# Patient Record
Sex: Female | Born: 1945 | Race: White | Hispanic: No | Marital: Married | Smoking: Never smoker
Health system: Southern US, Community
[De-identification: ages and names within clinical notes are randomized; demographics above are authoritative.]

## PROBLEM LIST (undated history)

## (undated) DIAGNOSIS — C801 Malignant (primary) neoplasm, unspecified: Secondary | ICD-10-CM

## (undated) HISTORY — DX: Malignant (primary) neoplasm, unspecified: C80.1

## (undated) HISTORY — PX: BREAST LUMPECTOMY: SHX2

---

## 2009-12-21 ENCOUNTER — Encounter: Admission: RE | Admit: 2009-12-21 | Discharge: 2009-12-21 | Payer: Self-pay | Admitting: *Deleted

## 2010-06-09 ENCOUNTER — Other Ambulatory Visit
Admission: RE | Admit: 2010-06-09 | Discharge: 2010-06-09 | Payer: Self-pay | Source: Home / Self Care | Admitting: Internal Medicine

## 2010-11-20 ENCOUNTER — Other Ambulatory Visit: Payer: Self-pay | Admitting: Internal Medicine

## 2010-11-20 DIAGNOSIS — Z1231 Encounter for screening mammogram for malignant neoplasm of breast: Secondary | ICD-10-CM

## 2010-12-26 ENCOUNTER — Ambulatory Visit
Admission: RE | Admit: 2010-12-26 | Discharge: 2010-12-26 | Disposition: A | Discharge status: Home / Self Care | Payer: Medicare Other | Source: Ambulatory Visit | Attending: Internal Medicine | Admitting: Internal Medicine

## 2010-12-26 DIAGNOSIS — Z1231 Encounter for screening mammogram for malignant neoplasm of breast: Secondary | ICD-10-CM

## 2011-01-11 ENCOUNTER — Other Ambulatory Visit: Payer: Self-pay | Admitting: Internal Medicine

## 2011-01-11 DIAGNOSIS — R0989 Other specified symptoms and signs involving the circulatory and respiratory systems: Secondary | ICD-10-CM

## 2011-01-16 ENCOUNTER — Ambulatory Visit
Admission: RE | Admit: 2011-01-16 | Discharge: 2011-01-16 | Disposition: A | Discharge status: Home / Self Care | Payer: Medicare Other | Source: Ambulatory Visit | Attending: Internal Medicine | Admitting: Internal Medicine

## 2011-01-16 DIAGNOSIS — R0989 Other specified symptoms and signs involving the circulatory and respiratory systems: Secondary | ICD-10-CM

## 2011-06-19 DIAGNOSIS — H2510 Age-related nuclear cataract, unspecified eye: Secondary | ICD-10-CM | POA: Diagnosis not present

## 2011-08-14 DIAGNOSIS — E785 Hyperlipidemia, unspecified: Secondary | ICD-10-CM | POA: Diagnosis not present

## 2011-08-21 DIAGNOSIS — M19079 Primary osteoarthritis, unspecified ankle and foot: Secondary | ICD-10-CM | POA: Diagnosis not present

## 2011-08-21 DIAGNOSIS — M19049 Primary osteoarthritis, unspecified hand: Secondary | ICD-10-CM | POA: Diagnosis not present

## 2011-08-21 DIAGNOSIS — E785 Hyperlipidemia, unspecified: Secondary | ICD-10-CM | POA: Diagnosis not present

## 2011-08-21 DIAGNOSIS — I6529 Occlusion and stenosis of unspecified carotid artery: Secondary | ICD-10-CM | POA: Diagnosis not present

## 2011-12-10 ENCOUNTER — Other Ambulatory Visit: Payer: Self-pay | Admitting: *Deleted

## 2011-12-10 DIAGNOSIS — Z1231 Encounter for screening mammogram for malignant neoplasm of breast: Secondary | ICD-10-CM

## 2011-12-10 DIAGNOSIS — Z853 Personal history of malignant neoplasm of breast: Secondary | ICD-10-CM

## 2011-12-10 DIAGNOSIS — M8580 Other specified disorders of bone density and structure, unspecified site: Secondary | ICD-10-CM

## 2011-12-27 ENCOUNTER — Other Ambulatory Visit: Payer: Medicare Other

## 2011-12-27 ENCOUNTER — Ambulatory Visit: Payer: Medicare Other

## 2012-01-15 ENCOUNTER — Ambulatory Visit: Payer: Medicare Other

## 2012-01-15 ENCOUNTER — Other Ambulatory Visit: Payer: Medicare Other

## 2012-01-17 ENCOUNTER — Ambulatory Visit
Admission: RE | Admit: 2012-01-17 | Discharge: 2012-01-17 | Disposition: A | Discharge status: Home / Self Care | Payer: Medicare Other | Source: Ambulatory Visit | Attending: *Deleted | Admitting: *Deleted

## 2012-01-17 DIAGNOSIS — Z853 Personal history of malignant neoplasm of breast: Secondary | ICD-10-CM

## 2012-01-17 DIAGNOSIS — Z780 Asymptomatic menopausal state: Secondary | ICD-10-CM | POA: Diagnosis not present

## 2012-01-17 DIAGNOSIS — Z1231 Encounter for screening mammogram for malignant neoplasm of breast: Secondary | ICD-10-CM

## 2012-01-17 DIAGNOSIS — M8580 Other specified disorders of bone density and structure, unspecified site: Secondary | ICD-10-CM

## 2012-01-17 DIAGNOSIS — Z13820 Encounter for screening for osteoporosis: Secondary | ICD-10-CM | POA: Diagnosis not present

## 2012-02-08 IMAGING — US US CAROTID DUPLEX BILAT
1 series · 13 of 24 positions shown · non-contrast
Comparison: None.

CLINICAL DATA: Left neck bruit.

BILATERAL CAROTID DUPLEX ULTRASOUND
TECHNIQUE: Gray scale imaging, color Doppler and duplex ultrasound
was performed of bilateral carotid and vertebral arteries in the
neck.

[Series 1: us carotid duplex bilat · 0.06mm/px · 13 of 55 slices shown]
[im 1/55]
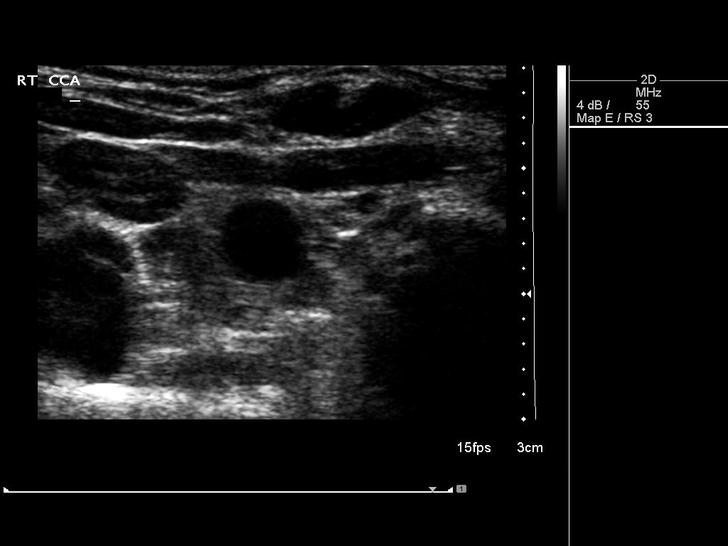
[im 5/55]
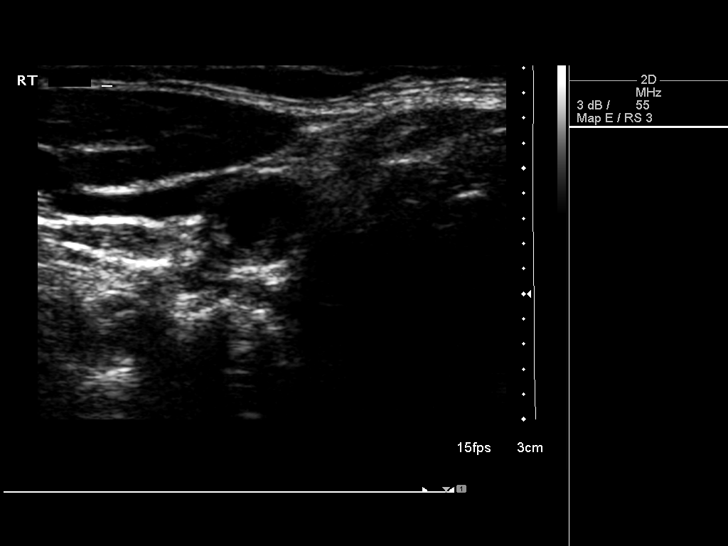
[im 10/55]
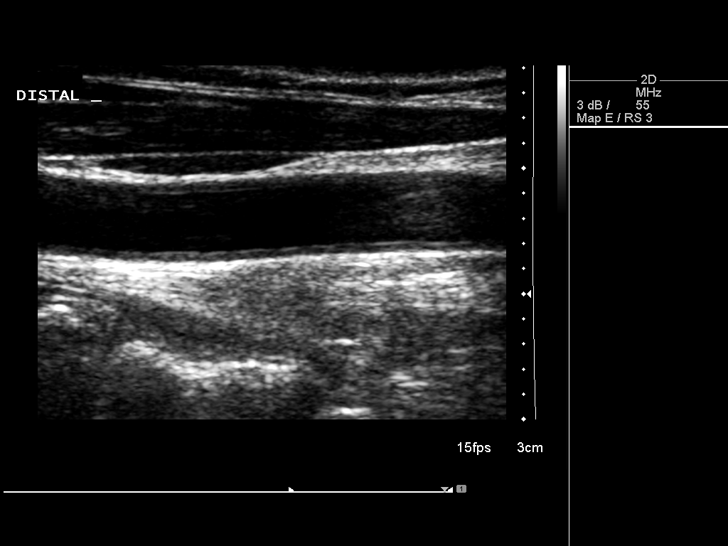
[im 15/55]
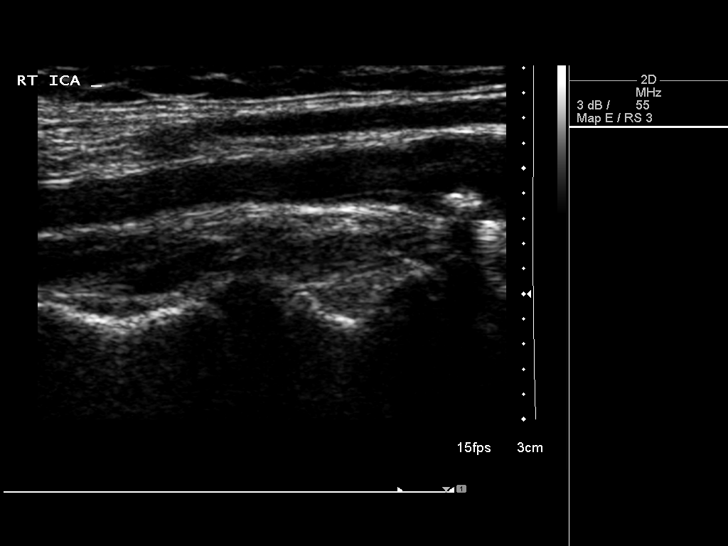
[im 19/55]
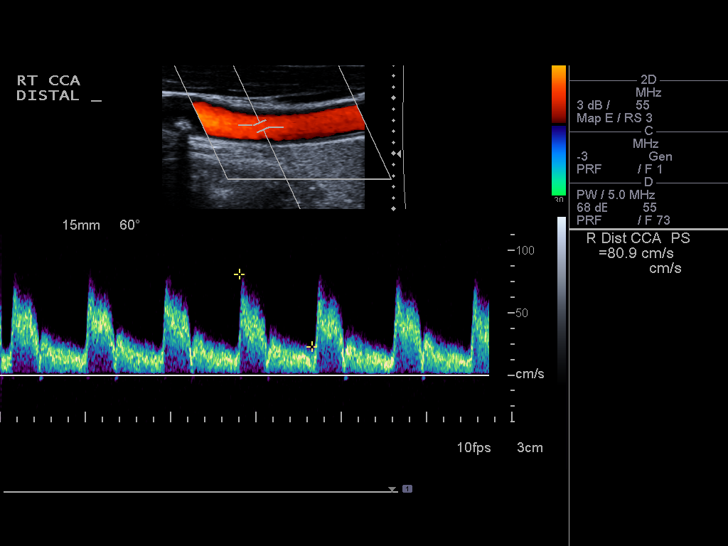
[im 24/55]
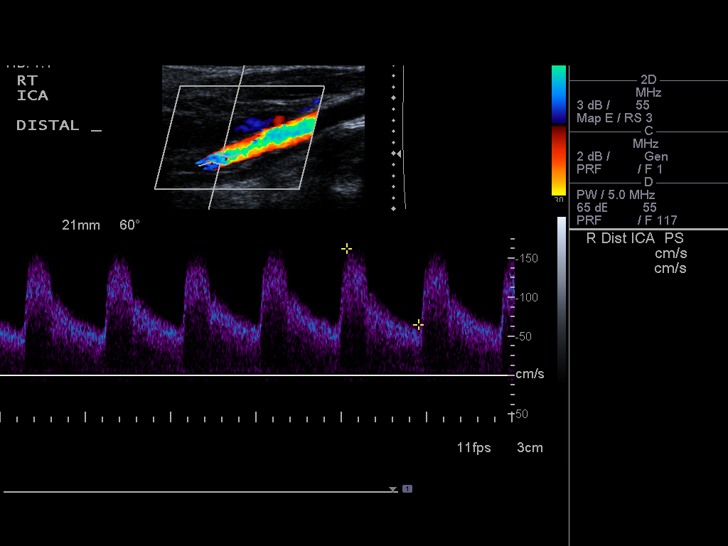
[im 29/55]
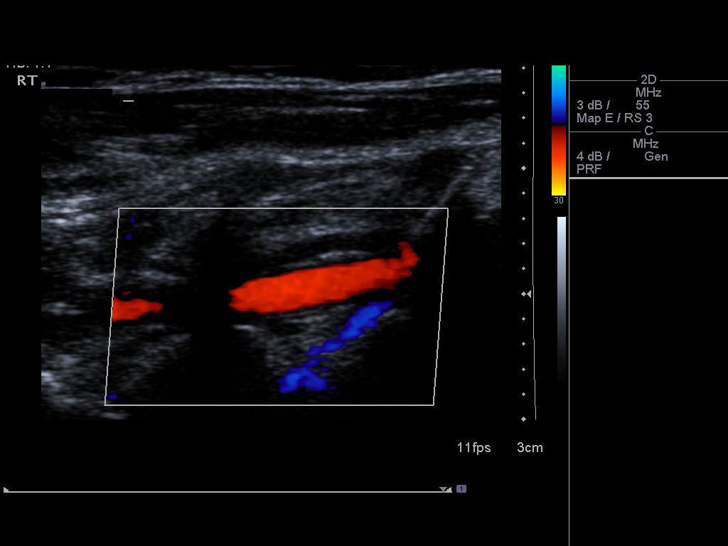
[im 31/55]
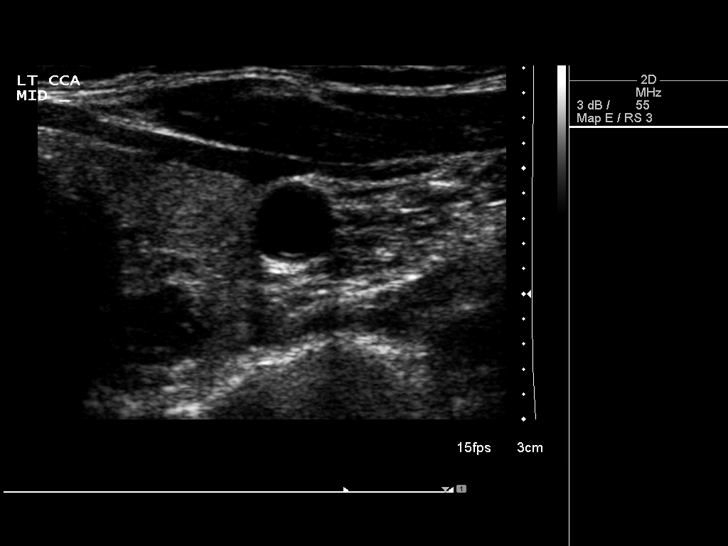
[im 36/55]
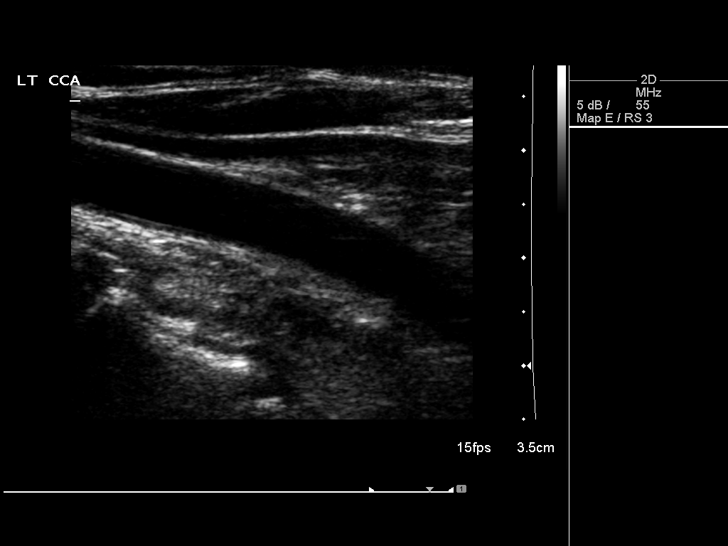
[im 40/55]
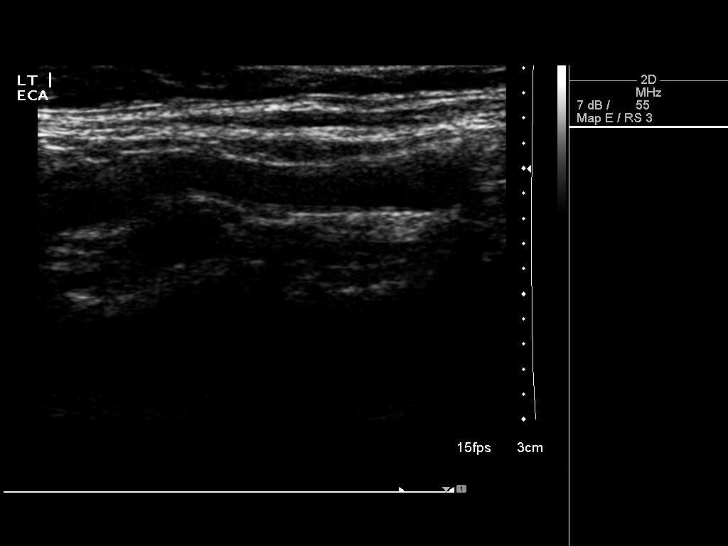
[im 45/55]
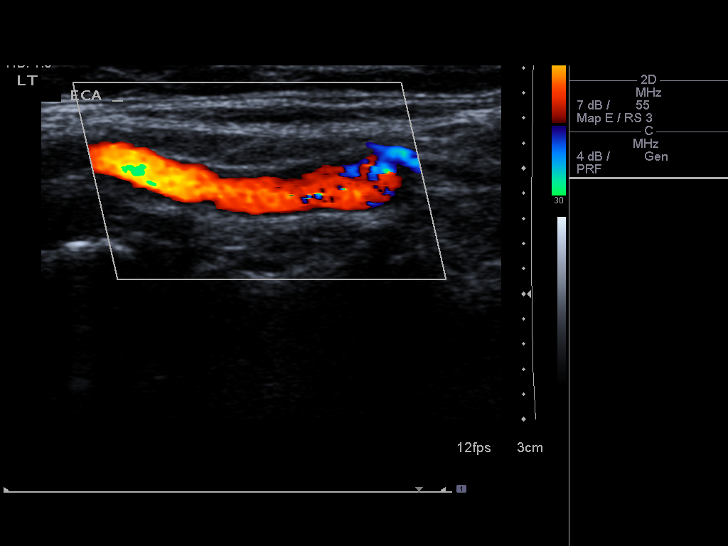
[im 50/55]
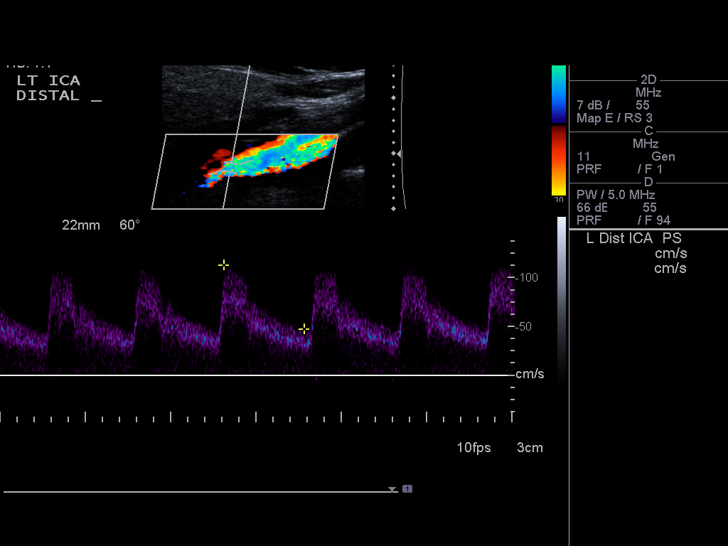
[im 55/55]
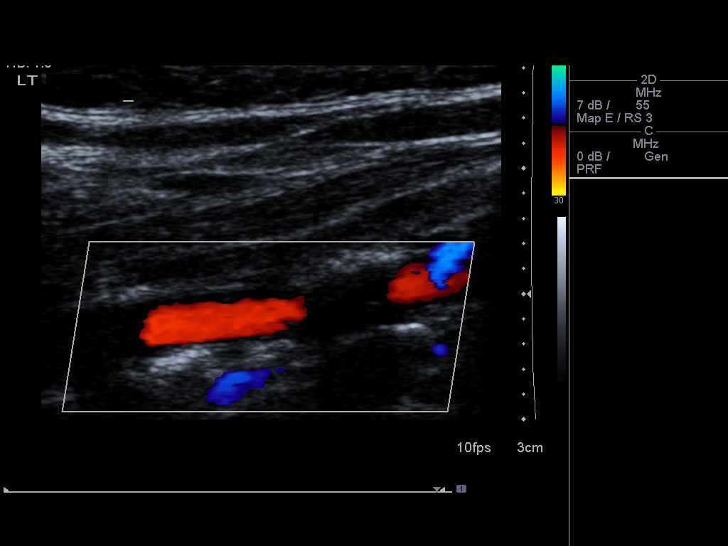

[13 of 24 positions shown; findings below may reference images not displayed]

Criteria:  Quantification of carotid stenosis is based on velocity
parameters that correlate the residual internal carotid diameter
with NASCET-based stenosis levels, using the diameter of the distal
internal carotid lumen as the denominator for stenosis measurement.

The following velocity measurements were obtained:

                 PEAK SYSTOLIC/END DIASTOLIC
RIGHT
ICA:                        162cm/sec
CCA:                        103cm/sec
SYSTOLIC ICA/CCA RATIO:
DIASTOLIC ICA/CCA RATIO:
ECA:                        102cm/sec

LEFT
ICA:                        113cm/sec
CCA:                        87cm/sec
SYSTOLIC ICA/CCA RATIO:
DIASTOLIC ICA/CCA RATIO:
ECA:                        98cm/sec
FINDINGS: RIGHT CAROTID ARTERY: Small amount of nonocclusive plaque at the
right carotid bulb.  Antegrade flow in the right carotid arteries.
The proximal right internal carotid artery velocity is slightly
elevated measuring 132 cm/sec.  The distal right internal carotid
artery end systolic velocity measures 162 cm/sec and end diastolic
velocity is 65 cm/sec.

RIGHT VERTEBRAL ARTERY:  Antegrade flow and normal waveform in the
right vertebral artery.

LEFT CAROTID ARTERY: There is a tiny amount of plaque or intimal
thickening at the left carotid bulb.  Antegrade flow in the left
carotid arteries.  Normal velocity measurements in the left
internal carotid artery.

LEFT VERTEBRAL ARTERY:  Antegrade flow and normal waveform in the
left vertebral artery.
IMPRESSION: Small amount of plaque involving the carotid bulbs, right side
greater than left.

Estimated degree of stenosis in the right internal carotid artery
is 50-69%.

Estimated degree stenosis in the left internal carotid artery is
less than 50%.

Patent bilateral vertebral arteries.

## 2012-02-14 ENCOUNTER — Other Ambulatory Visit: Payer: Self-pay | Admitting: Internal Medicine

## 2012-02-14 DIAGNOSIS — I6529 Occlusion and stenosis of unspecified carotid artery: Secondary | ICD-10-CM | POA: Diagnosis not present

## 2012-02-14 DIAGNOSIS — Z0000 Encounter for general adult medical examination without abnormal findings: Secondary | ICD-10-CM | POA: Diagnosis not present

## 2012-02-14 DIAGNOSIS — E785 Hyperlipidemia, unspecified: Secondary | ICD-10-CM | POA: Diagnosis not present

## 2012-02-25 ENCOUNTER — Ambulatory Visit
Admission: RE | Admit: 2012-02-25 | Discharge: 2012-02-25 | Disposition: A | Discharge status: Home / Self Care | Payer: Medicare Other | Source: Ambulatory Visit | Attending: Internal Medicine | Admitting: Internal Medicine

## 2012-02-25 DIAGNOSIS — I6529 Occlusion and stenosis of unspecified carotid artery: Secondary | ICD-10-CM | POA: Diagnosis not present

## 2012-02-29 DIAGNOSIS — E785 Hyperlipidemia, unspecified: Secondary | ICD-10-CM | POA: Diagnosis not present

## 2012-02-29 DIAGNOSIS — M19079 Primary osteoarthritis, unspecified ankle and foot: Secondary | ICD-10-CM | POA: Diagnosis not present

## 2012-02-29 DIAGNOSIS — Z23 Encounter for immunization: Secondary | ICD-10-CM | POA: Diagnosis not present

## 2012-02-29 DIAGNOSIS — M19049 Primary osteoarthritis, unspecified hand: Secondary | ICD-10-CM | POA: Diagnosis not present

## 2012-02-29 DIAGNOSIS — I6529 Occlusion and stenosis of unspecified carotid artery: Secondary | ICD-10-CM | POA: Diagnosis not present

## 2012-03-19 DIAGNOSIS — C50319 Malignant neoplasm of lower-inner quadrant of unspecified female breast: Secondary | ICD-10-CM | POA: Diagnosis not present

## 2012-06-18 DIAGNOSIS — R55 Syncope and collapse: Secondary | ICD-10-CM | POA: Diagnosis not present

## 2012-06-18 DIAGNOSIS — R252 Cramp and spasm: Secondary | ICD-10-CM | POA: Diagnosis not present

## 2012-06-30 DIAGNOSIS — H2510 Age-related nuclear cataract, unspecified eye: Secondary | ICD-10-CM | POA: Diagnosis not present

## 2012-07-23 DIAGNOSIS — D485 Neoplasm of uncertain behavior of skin: Secondary | ICD-10-CM | POA: Diagnosis not present

## 2012-07-23 DIAGNOSIS — R21 Rash and other nonspecific skin eruption: Secondary | ICD-10-CM | POA: Diagnosis not present

## 2012-07-23 DIAGNOSIS — L259 Unspecified contact dermatitis, unspecified cause: Secondary | ICD-10-CM | POA: Diagnosis not present

## 2012-07-23 DIAGNOSIS — L570 Actinic keratosis: Secondary | ICD-10-CM | POA: Diagnosis not present

## 2012-07-23 DIAGNOSIS — L439 Lichen planus, unspecified: Secondary | ICD-10-CM | POA: Diagnosis not present

## 2012-08-21 DIAGNOSIS — M899 Disorder of bone, unspecified: Secondary | ICD-10-CM | POA: Diagnosis not present

## 2012-08-21 DIAGNOSIS — M949 Disorder of cartilage, unspecified: Secondary | ICD-10-CM | POA: Diagnosis not present

## 2012-08-21 DIAGNOSIS — E785 Hyperlipidemia, unspecified: Secondary | ICD-10-CM | POA: Diagnosis not present

## 2012-08-26 DIAGNOSIS — M19049 Primary osteoarthritis, unspecified hand: Secondary | ICD-10-CM | POA: Diagnosis not present

## 2012-08-26 DIAGNOSIS — E785 Hyperlipidemia, unspecified: Secondary | ICD-10-CM | POA: Diagnosis not present

## 2012-08-26 DIAGNOSIS — I6529 Occlusion and stenosis of unspecified carotid artery: Secondary | ICD-10-CM | POA: Diagnosis not present

## 2012-08-26 DIAGNOSIS — M899 Disorder of bone, unspecified: Secondary | ICD-10-CM | POA: Diagnosis not present

## 2012-08-29 DIAGNOSIS — L439 Lichen planus, unspecified: Secondary | ICD-10-CM | POA: Diagnosis not present

## 2012-10-29 DIAGNOSIS — L439 Lichen planus, unspecified: Secondary | ICD-10-CM | POA: Diagnosis not present

## 2012-11-10 DIAGNOSIS — S20219A Contusion of unspecified front wall of thorax, initial encounter: Secondary | ICD-10-CM | POA: Diagnosis not present

## 2012-11-10 DIAGNOSIS — S2239XA Fracture of one rib, unspecified side, initial encounter for closed fracture: Secondary | ICD-10-CM | POA: Diagnosis not present

## 2012-12-09 ENCOUNTER — Other Ambulatory Visit: Payer: Self-pay

## 2012-12-09 DIAGNOSIS — Z1231 Encounter for screening mammogram for malignant neoplasm of breast: Secondary | ICD-10-CM

## 2013-02-23 DIAGNOSIS — E785 Hyperlipidemia, unspecified: Secondary | ICD-10-CM | POA: Diagnosis not present

## 2013-02-23 DIAGNOSIS — Z23 Encounter for immunization: Secondary | ICD-10-CM | POA: Diagnosis not present

## 2013-02-23 DIAGNOSIS — Z0000 Encounter for general adult medical examination without abnormal findings: Secondary | ICD-10-CM | POA: Diagnosis not present

## 2013-02-23 DIAGNOSIS — M899 Disorder of bone, unspecified: Secondary | ICD-10-CM | POA: Diagnosis not present

## 2013-02-23 DIAGNOSIS — Z1331 Encounter for screening for depression: Secondary | ICD-10-CM | POA: Diagnosis not present

## 2013-02-24 ENCOUNTER — Ambulatory Visit: Payer: Medicare Other

## 2013-02-24 ENCOUNTER — Other Ambulatory Visit: Payer: Self-pay | Admitting: Internal Medicine

## 2013-02-24 ENCOUNTER — Ambulatory Visit
Admission: RE | Admit: 2013-02-24 | Discharge: 2013-02-24 | Disposition: A | Discharge status: Home / Self Care | Payer: Medicare Other | Source: Ambulatory Visit

## 2013-02-24 DIAGNOSIS — Z1231 Encounter for screening mammogram for malignant neoplasm of breast: Secondary | ICD-10-CM

## 2013-03-02 DIAGNOSIS — I6529 Occlusion and stenosis of unspecified carotid artery: Secondary | ICD-10-CM | POA: Diagnosis not present

## 2013-03-02 DIAGNOSIS — E785 Hyperlipidemia, unspecified: Secondary | ICD-10-CM | POA: Diagnosis not present

## 2013-03-02 DIAGNOSIS — M899 Disorder of bone, unspecified: Secondary | ICD-10-CM | POA: Diagnosis not present

## 2013-03-02 DIAGNOSIS — N182 Chronic kidney disease, stage 2 (mild): Secondary | ICD-10-CM | POA: Diagnosis not present

## 2013-03-19 IMAGING — US US CAROTID DUPLEX BILAT
1 series · 13 of 24 positions shown · non-contrast
Comparison: 01/16/2011

CLINICAL DATA: Occlusion and stenosis

BILATERAL CAROTID DUPLEX ULTRASOUND
TECHNIQUE: Gray scale imaging, color Doppler and duplex ultrasound
was performed of bilateral carotid and vertebral arteries in the
neck.

[Series 1: us carotid duplex bilat · 0.06mm/px · 13 of 62 slices shown]
[im 1/62]
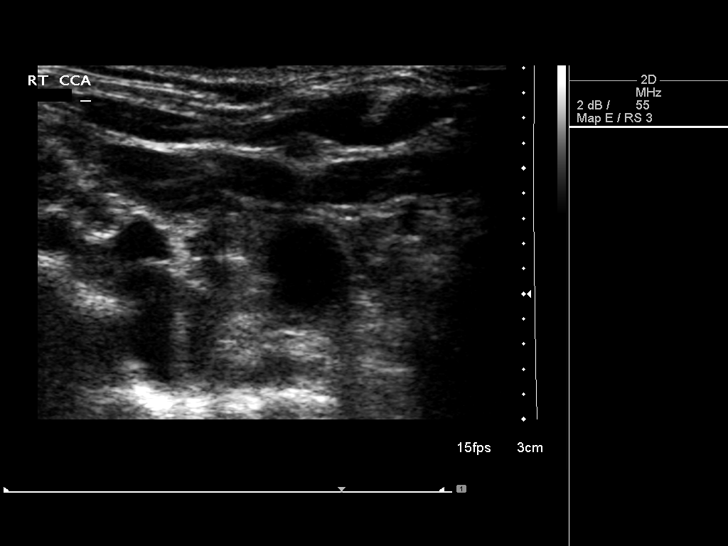
[im 6/62]
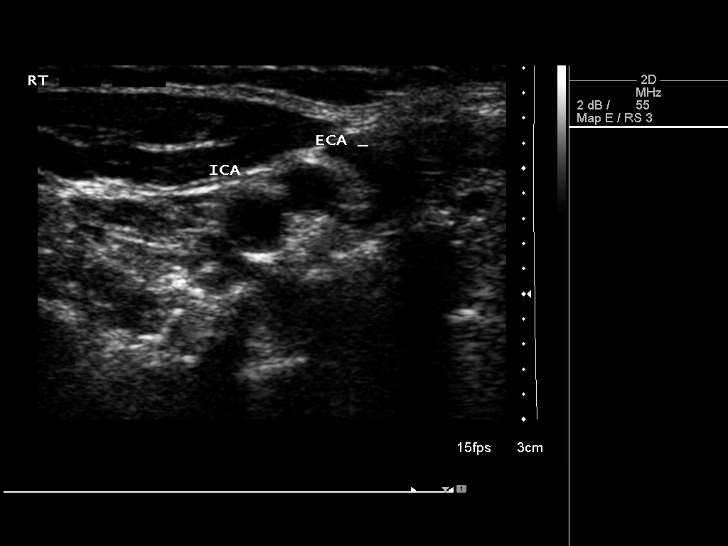
[im 11/62]
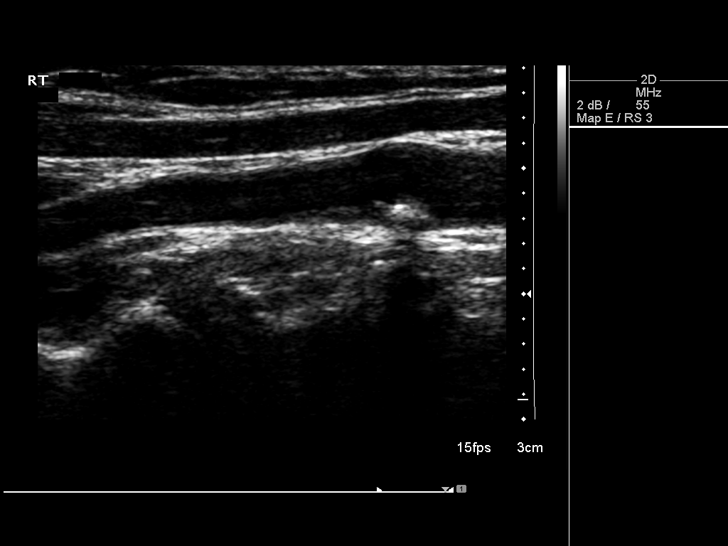
[im 16/62]
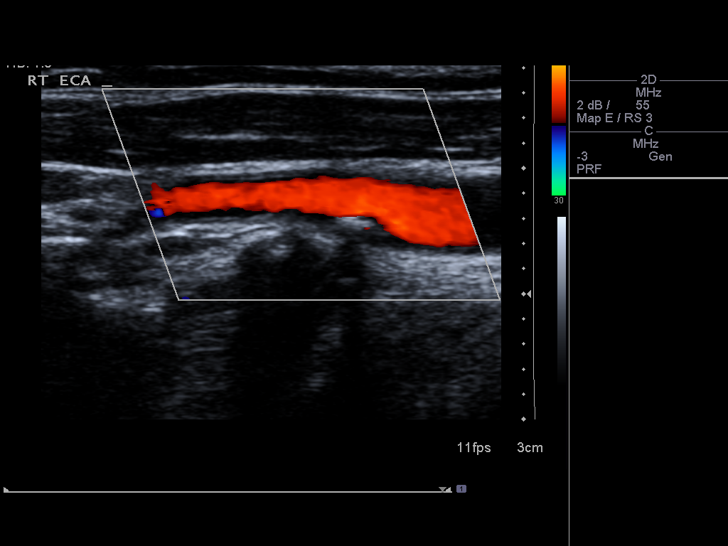
[im 22/62]
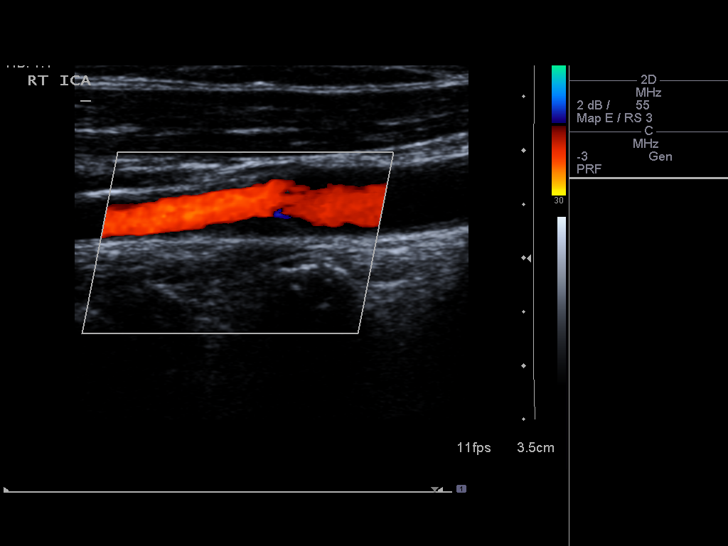
[im 27/62]
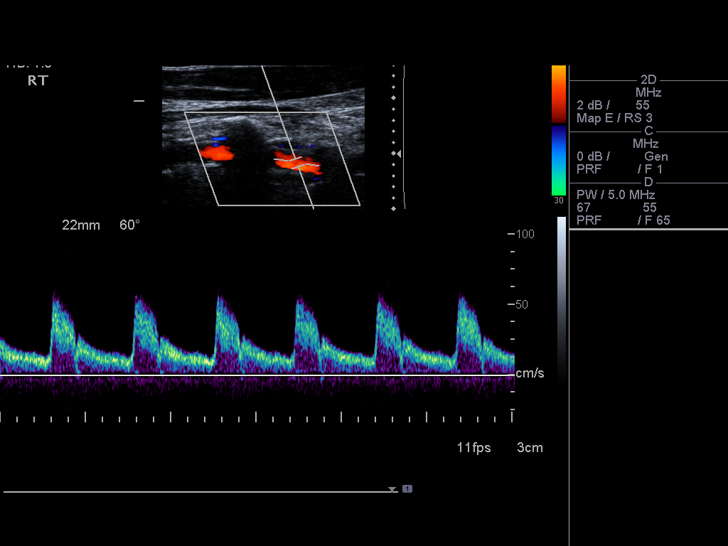
[im 32/62]
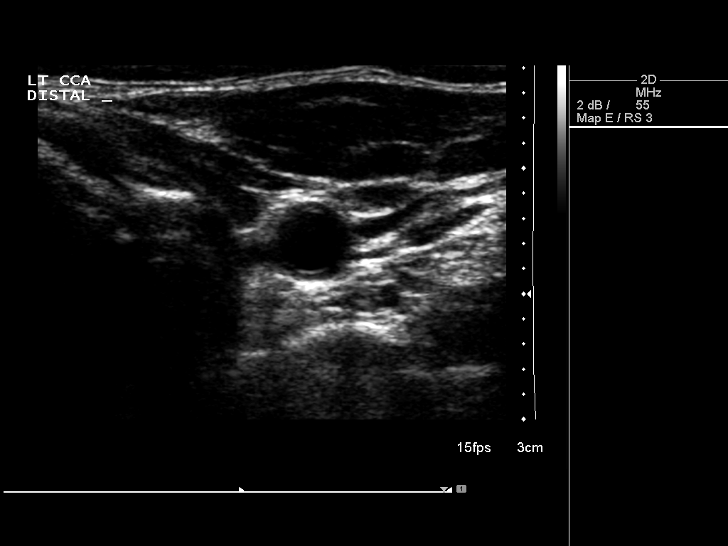
[im 35/62]
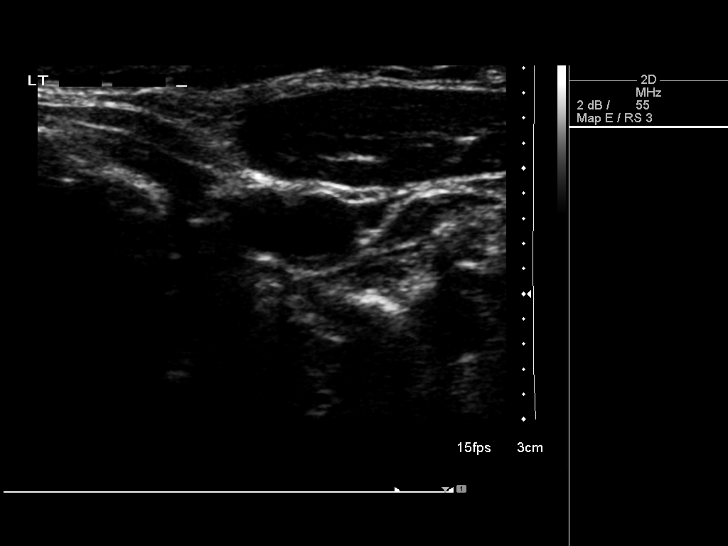
[im 40/62]
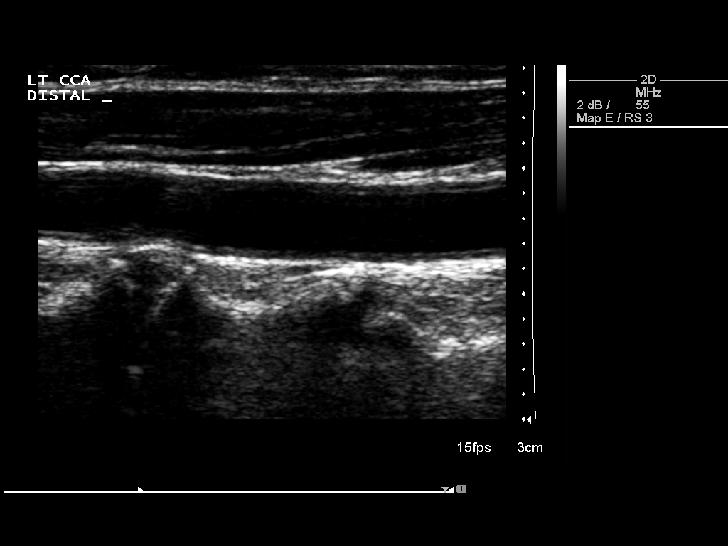
[im 46/62]
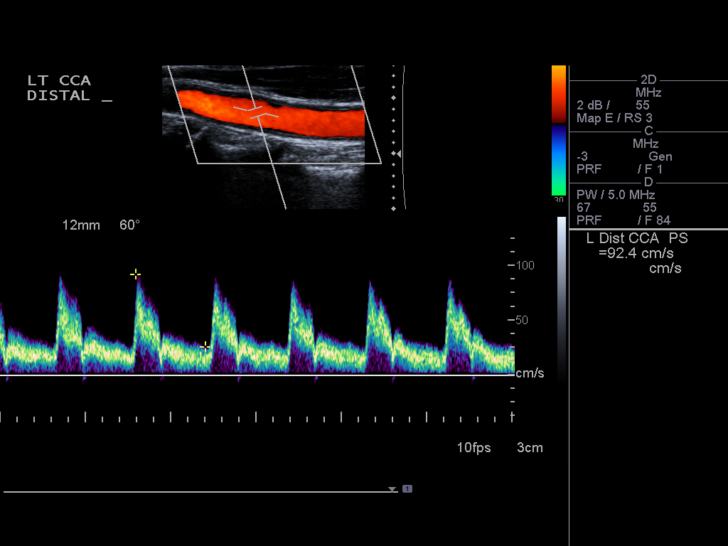
[im 51/62]
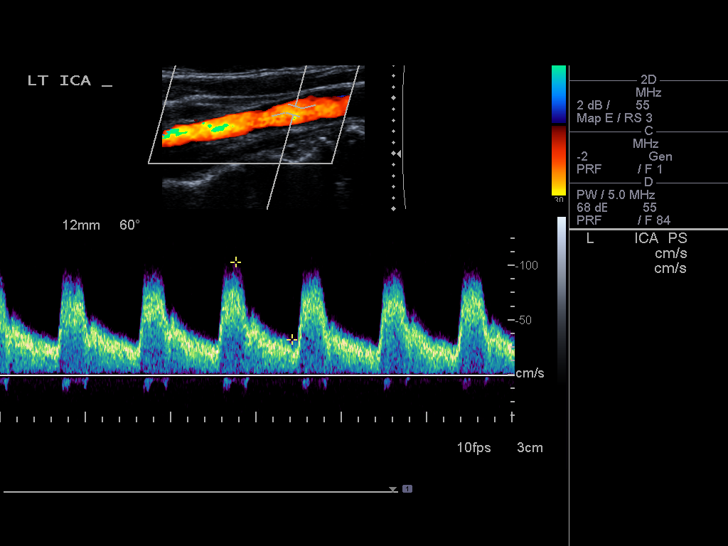
[im 56/62]
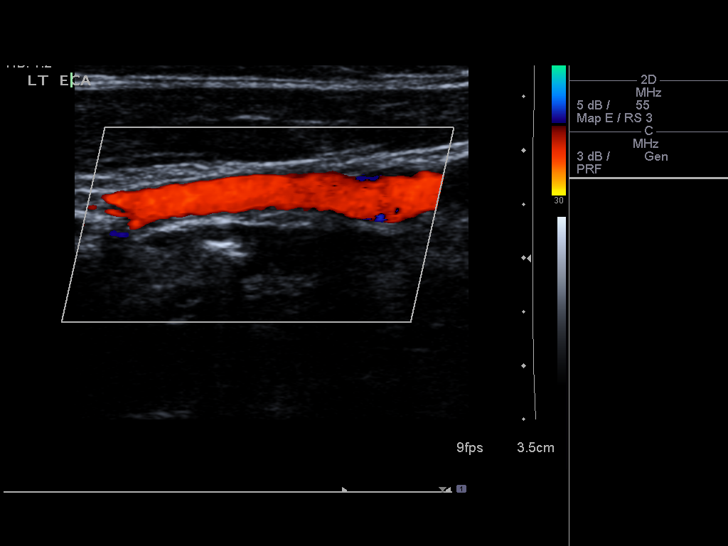
[im 62/62]
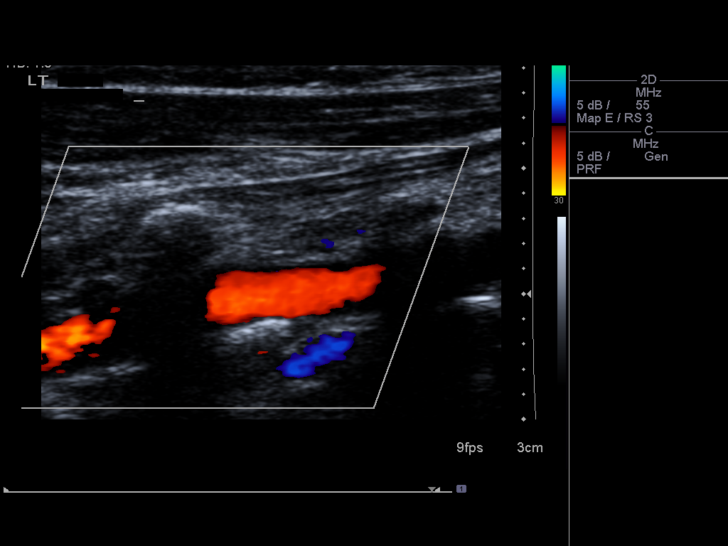

[13 of 24 positions shown; findings below may reference images not displayed]

Criteria:  Quantification of carotid stenosis is based on velocity
parameters that correlate the residual internal carotid diameter
with NASCET-based stenosis levels, using the diameter of the distal
internal carotid lumen as the denominator for stenosis measurement.

The following velocity measurements were obtained:

                 PEAK SYSTOLIC/END DIASTOLIC
RIGHT
ICA:                        152/57cm/sec
CCA:                        87/24cm/sec
SYSTOLIC ICA/CCA RATIO:
DIASTOLIC ICA/CCA RATIO:
ECA:                        110cm/sec

LEFT
ICA:                        113/42cm/sec
CCA:                        96/25cm/sec
SYSTOLIC ICA/CCA RATIO:
DIASTOLIC ICA/CCA RATIO:
ECA:                        113cm/sec
FINDINGS: RIGHT CAROTID ARTERY: The preliminary gray scale images demonstrate
intimal thickening is well as atherosclerotic plaque in the region
of the carotid bulb and proximal internal carotid artery.  This
contributes to a stenosis in the 50-69% range in the lower end of
that range.

RIGHT VERTEBRAL ARTERY:  Antegrade in nature

LEFT CAROTID ARTERY: Mild plaque is noted on the left in the
carotid bulb extending in the proximal internal carotid artery.
The waveforms, velocities and flow velocity ratios however
demonstrate no evidence of focal hemodynamically significant
stenosis.

LEFT VERTEBRAL ARTERY:  Antegrade in nature
IMPRESSION: Stenosis in the 50-69% range and the right internal carotid artery.
No focal hemodynamically significant stenosis is noted on the left.
The overall appearance is similar to that seen on the prior exam.

## 2013-03-25 DIAGNOSIS — C50319 Malignant neoplasm of lower-inner quadrant of unspecified female breast: Secondary | ICD-10-CM | POA: Diagnosis not present

## 2013-04-01 ENCOUNTER — Ambulatory Visit
Admission: RE | Admit: 2013-04-01 | Discharge: 2013-04-01 | Disposition: A | Discharge status: Home / Self Care | Payer: Medicare Other | Source: Ambulatory Visit | Attending: Internal Medicine | Admitting: Internal Medicine

## 2013-04-01 DIAGNOSIS — I658 Occlusion and stenosis of other precerebral arteries: Secondary | ICD-10-CM | POA: Diagnosis not present

## 2013-06-03 DIAGNOSIS — L439 Lichen planus, unspecified: Secondary | ICD-10-CM | POA: Diagnosis not present

## 2013-06-03 DIAGNOSIS — L259 Unspecified contact dermatitis, unspecified cause: Secondary | ICD-10-CM | POA: Diagnosis not present

## 2013-06-03 DIAGNOSIS — L821 Other seborrheic keratosis: Secondary | ICD-10-CM | POA: Diagnosis not present

## 2013-06-03 DIAGNOSIS — D1801 Hemangioma of skin and subcutaneous tissue: Secondary | ICD-10-CM | POA: Diagnosis not present

## 2013-06-09 DIAGNOSIS — L600 Ingrowing nail: Secondary | ICD-10-CM | POA: Diagnosis not present

## 2013-06-15 ENCOUNTER — Ambulatory Visit: Payer: Self-pay | Admitting: Podiatry

## 2013-06-18 ENCOUNTER — Ambulatory Visit: Payer: Medicare Other | Admitting: Podiatry

## 2013-06-18 ENCOUNTER — Encounter: Payer: Self-pay | Admitting: Podiatry

## 2013-06-18 ENCOUNTER — Ambulatory Visit: Payer: Self-pay | Admitting: Podiatrist

## 2013-06-18 VITALS — BP 161/79 | HR 80 | Resp 16 | Ht 66.0 in | Wt 155.0 lb

## 2013-06-18 DIAGNOSIS — L600 Ingrowing nail: Secondary | ICD-10-CM | POA: Diagnosis not present

## 2013-06-18 NOTE — Progress Notes (Signed)
Subjective:     Patient ID: Jill Morton, female   DOB: 26-Oct-1945, 68 y.o.   MRN: [id number]  HPI patient states I am getting ingrown toenails on both my big toes. States the right one hurts on both borders and the left one hurts on the outside border against her shoe she is trying to trim them and so without relief of symptoms  Review of Systems     Objective:   Physical Exam Neurovascular status intact with incurvated ingrown toenail deformity big toe right medial and lateral border and medial border left hallux with pain upon palpation    Assessment:     Ingrown toenail deformity hallux both feet medial and lateral border right medial border left    Plan:     H&P reviewed and condition discussed. I reviewed the permanent removal of nail corners and explained procedure reviewing risks of surgery. Patient wants to have this performed understands risk and at this time I infiltrated each hallux 60 mg Xylocaine Marcaine mixture remove the medial and lateral border right hallux medial border left hallux exposed matrix and applied chemical phenol 30 seconds to each border 3 applications followed by alcohol lavaged and sterile dressing. Reappoint to recheck again

## 2013-06-18 NOTE — Progress Notes (Signed)
   Subjective:    Patient ID: Jill Morton, female    DOB: 1946/01/16, 68 y.o.   MRN: [id number]  HPI Comments: N pain L b/l great toenails- rt is worse D 1 m O sudden C same A bed sheets T wearing different shoes     Review of Systems  All other systems reviewed and are negative.       Objective:   Physical Exam        Assessment & Plan:

## 2013-06-18 NOTE — Patient Instructions (Signed)

## 2013-07-06 DIAGNOSIS — H2510 Age-related nuclear cataract, unspecified eye: Secondary | ICD-10-CM | POA: Diagnosis not present

## 2013-08-31 DIAGNOSIS — M899 Disorder of bone, unspecified: Secondary | ICD-10-CM | POA: Diagnosis not present

## 2013-08-31 DIAGNOSIS — M949 Disorder of cartilage, unspecified: Secondary | ICD-10-CM | POA: Diagnosis not present

## 2013-08-31 DIAGNOSIS — E785 Hyperlipidemia, unspecified: Secondary | ICD-10-CM | POA: Diagnosis not present

## 2013-09-07 DIAGNOSIS — N182 Chronic kidney disease, stage 2 (mild): Secondary | ICD-10-CM | POA: Diagnosis not present

## 2013-09-07 DIAGNOSIS — M949 Disorder of cartilage, unspecified: Secondary | ICD-10-CM | POA: Diagnosis not present

## 2013-09-07 DIAGNOSIS — I6529 Occlusion and stenosis of unspecified carotid artery: Secondary | ICD-10-CM | POA: Diagnosis not present

## 2013-09-07 DIAGNOSIS — M899 Disorder of bone, unspecified: Secondary | ICD-10-CM | POA: Diagnosis not present

## 2013-09-07 DIAGNOSIS — E785 Hyperlipidemia, unspecified: Secondary | ICD-10-CM | POA: Diagnosis not present

## 2013-10-24 DIAGNOSIS — K5732 Diverticulitis of large intestine without perforation or abscess without bleeding: Secondary | ICD-10-CM | POA: Diagnosis not present

## 2014-01-04 DIAGNOSIS — M5137 Other intervertebral disc degeneration, lumbosacral region: Secondary | ICD-10-CM | POA: Diagnosis not present

## 2014-01-04 DIAGNOSIS — M5030 Other cervical disc degeneration, unspecified cervical region: Secondary | ICD-10-CM | POA: Diagnosis not present

## 2014-01-04 DIAGNOSIS — M999 Biomechanical lesion, unspecified: Secondary | ICD-10-CM | POA: Diagnosis not present

## 2014-01-04 DIAGNOSIS — M9981 Other biomechanical lesions of cervical region: Secondary | ICD-10-CM | POA: Diagnosis not present

## 2014-01-05 DIAGNOSIS — M5137 Other intervertebral disc degeneration, lumbosacral region: Secondary | ICD-10-CM | POA: Diagnosis not present

## 2014-01-05 DIAGNOSIS — M999 Biomechanical lesion, unspecified: Secondary | ICD-10-CM | POA: Diagnosis not present

## 2014-01-05 DIAGNOSIS — M9981 Other biomechanical lesions of cervical region: Secondary | ICD-10-CM | POA: Diagnosis not present

## 2014-01-05 DIAGNOSIS — M5030 Other cervical disc degeneration, unspecified cervical region: Secondary | ICD-10-CM | POA: Diagnosis not present

## 2014-01-06 DIAGNOSIS — M999 Biomechanical lesion, unspecified: Secondary | ICD-10-CM | POA: Diagnosis not present

## 2014-01-06 DIAGNOSIS — M5030 Other cervical disc degeneration, unspecified cervical region: Secondary | ICD-10-CM | POA: Diagnosis not present

## 2014-01-06 DIAGNOSIS — M5137 Other intervertebral disc degeneration, lumbosacral region: Secondary | ICD-10-CM | POA: Diagnosis not present

## 2014-01-06 DIAGNOSIS — M9981 Other biomechanical lesions of cervical region: Secondary | ICD-10-CM | POA: Diagnosis not present

## 2014-01-07 DIAGNOSIS — M999 Biomechanical lesion, unspecified: Secondary | ICD-10-CM | POA: Diagnosis not present

## 2014-01-07 DIAGNOSIS — M5030 Other cervical disc degeneration, unspecified cervical region: Secondary | ICD-10-CM | POA: Diagnosis not present

## 2014-01-07 DIAGNOSIS — M9981 Other biomechanical lesions of cervical region: Secondary | ICD-10-CM | POA: Diagnosis not present

## 2014-01-07 DIAGNOSIS — M5137 Other intervertebral disc degeneration, lumbosacral region: Secondary | ICD-10-CM | POA: Diagnosis not present

## 2014-01-18 DIAGNOSIS — M5137 Other intervertebral disc degeneration, lumbosacral region: Secondary | ICD-10-CM | POA: Diagnosis not present

## 2014-01-18 DIAGNOSIS — M5030 Other cervical disc degeneration, unspecified cervical region: Secondary | ICD-10-CM | POA: Diagnosis not present

## 2014-01-18 DIAGNOSIS — M9981 Other biomechanical lesions of cervical region: Secondary | ICD-10-CM | POA: Diagnosis not present

## 2014-01-18 DIAGNOSIS — M999 Biomechanical lesion, unspecified: Secondary | ICD-10-CM | POA: Diagnosis not present

## 2014-01-19 DIAGNOSIS — M5137 Other intervertebral disc degeneration, lumbosacral region: Secondary | ICD-10-CM | POA: Diagnosis not present

## 2014-01-19 DIAGNOSIS — M5030 Other cervical disc degeneration, unspecified cervical region: Secondary | ICD-10-CM | POA: Diagnosis not present

## 2014-01-19 DIAGNOSIS — M9981 Other biomechanical lesions of cervical region: Secondary | ICD-10-CM | POA: Diagnosis not present

## 2014-01-19 DIAGNOSIS — M999 Biomechanical lesion, unspecified: Secondary | ICD-10-CM | POA: Diagnosis not present

## 2014-01-20 ENCOUNTER — Other Ambulatory Visit: Payer: Self-pay

## 2014-01-20 DIAGNOSIS — M9981 Other biomechanical lesions of cervical region: Secondary | ICD-10-CM | POA: Diagnosis not present

## 2014-01-20 DIAGNOSIS — M5030 Other cervical disc degeneration, unspecified cervical region: Secondary | ICD-10-CM | POA: Diagnosis not present

## 2014-01-20 DIAGNOSIS — M999 Biomechanical lesion, unspecified: Secondary | ICD-10-CM | POA: Diagnosis not present

## 2014-01-20 DIAGNOSIS — M5137 Other intervertebral disc degeneration, lumbosacral region: Secondary | ICD-10-CM | POA: Diagnosis not present

## 2014-01-20 DIAGNOSIS — Z1231 Encounter for screening mammogram for malignant neoplasm of breast: Secondary | ICD-10-CM

## 2014-01-25 DIAGNOSIS — M9981 Other biomechanical lesions of cervical region: Secondary | ICD-10-CM | POA: Diagnosis not present

## 2014-01-25 DIAGNOSIS — M5030 Other cervical disc degeneration, unspecified cervical region: Secondary | ICD-10-CM | POA: Diagnosis not present

## 2014-01-25 DIAGNOSIS — M5137 Other intervertebral disc degeneration, lumbosacral region: Secondary | ICD-10-CM | POA: Diagnosis not present

## 2014-01-25 DIAGNOSIS — M999 Biomechanical lesion, unspecified: Secondary | ICD-10-CM | POA: Diagnosis not present

## 2014-01-27 DIAGNOSIS — M5137 Other intervertebral disc degeneration, lumbosacral region: Secondary | ICD-10-CM | POA: Diagnosis not present

## 2014-01-27 DIAGNOSIS — M999 Biomechanical lesion, unspecified: Secondary | ICD-10-CM | POA: Diagnosis not present

## 2014-01-27 DIAGNOSIS — M9981 Other biomechanical lesions of cervical region: Secondary | ICD-10-CM | POA: Diagnosis not present

## 2014-01-27 DIAGNOSIS — M5030 Other cervical disc degeneration, unspecified cervical region: Secondary | ICD-10-CM | POA: Diagnosis not present

## 2014-02-01 DIAGNOSIS — M9981 Other biomechanical lesions of cervical region: Secondary | ICD-10-CM | POA: Diagnosis not present

## 2014-02-01 DIAGNOSIS — M5030 Other cervical disc degeneration, unspecified cervical region: Secondary | ICD-10-CM | POA: Diagnosis not present

## 2014-02-01 DIAGNOSIS — M999 Biomechanical lesion, unspecified: Secondary | ICD-10-CM | POA: Diagnosis not present

## 2014-02-01 DIAGNOSIS — M5137 Other intervertebral disc degeneration, lumbosacral region: Secondary | ICD-10-CM | POA: Diagnosis not present

## 2014-02-03 DIAGNOSIS — M9981 Other biomechanical lesions of cervical region: Secondary | ICD-10-CM | POA: Diagnosis not present

## 2014-02-03 DIAGNOSIS — M999 Biomechanical lesion, unspecified: Secondary | ICD-10-CM | POA: Diagnosis not present

## 2014-02-03 DIAGNOSIS — M5030 Other cervical disc degeneration, unspecified cervical region: Secondary | ICD-10-CM | POA: Diagnosis not present

## 2014-02-03 DIAGNOSIS — M5137 Other intervertebral disc degeneration, lumbosacral region: Secondary | ICD-10-CM | POA: Diagnosis not present

## 2014-02-09 DIAGNOSIS — M999 Biomechanical lesion, unspecified: Secondary | ICD-10-CM | POA: Diagnosis not present

## 2014-02-09 DIAGNOSIS — M5137 Other intervertebral disc degeneration, lumbosacral region: Secondary | ICD-10-CM | POA: Diagnosis not present

## 2014-02-09 DIAGNOSIS — M5030 Other cervical disc degeneration, unspecified cervical region: Secondary | ICD-10-CM | POA: Diagnosis not present

## 2014-02-09 DIAGNOSIS — M9981 Other biomechanical lesions of cervical region: Secondary | ICD-10-CM | POA: Diagnosis not present

## 2014-02-15 DIAGNOSIS — M5030 Other cervical disc degeneration, unspecified cervical region: Secondary | ICD-10-CM | POA: Diagnosis not present

## 2014-02-15 DIAGNOSIS — M5137 Other intervertebral disc degeneration, lumbosacral region: Secondary | ICD-10-CM | POA: Diagnosis not present

## 2014-02-15 DIAGNOSIS — M9981 Other biomechanical lesions of cervical region: Secondary | ICD-10-CM | POA: Diagnosis not present

## 2014-02-15 DIAGNOSIS — M999 Biomechanical lesion, unspecified: Secondary | ICD-10-CM | POA: Diagnosis not present

## 2014-03-01 ENCOUNTER — Other Ambulatory Visit: Payer: Self-pay | Admitting: Internal Medicine

## 2014-03-01 DIAGNOSIS — M949 Disorder of cartilage, unspecified: Secondary | ICD-10-CM | POA: Diagnosis not present

## 2014-03-01 DIAGNOSIS — M899 Disorder of bone, unspecified: Secondary | ICD-10-CM | POA: Diagnosis not present

## 2014-03-01 DIAGNOSIS — E785 Hyperlipidemia, unspecified: Secondary | ICD-10-CM | POA: Diagnosis not present

## 2014-03-01 DIAGNOSIS — I6529 Occlusion and stenosis of unspecified carotid artery: Secondary | ICD-10-CM

## 2014-03-01 DIAGNOSIS — Z0000 Encounter for general adult medical examination without abnormal findings: Secondary | ICD-10-CM | POA: Diagnosis not present

## 2014-03-01 DIAGNOSIS — Z1331 Encounter for screening for depression: Secondary | ICD-10-CM | POA: Diagnosis not present

## 2014-03-09 ENCOUNTER — Ambulatory Visit
Admission: RE | Admit: 2014-03-09 | Discharge: 2014-03-09 | Disposition: A | Discharge status: Home / Self Care | Payer: Medicare Other | Source: Ambulatory Visit

## 2014-03-09 DIAGNOSIS — Z1231 Encounter for screening mammogram for malignant neoplasm of breast: Secondary | ICD-10-CM | POA: Diagnosis not present

## 2014-03-10 ENCOUNTER — Ambulatory Visit
Admission: RE | Admit: 2014-03-10 | Discharge: 2014-03-10 | Disposition: A | Discharge status: Home / Self Care | Payer: Medicare Other | Source: Ambulatory Visit | Attending: Internal Medicine | Admitting: Internal Medicine

## 2014-03-10 DIAGNOSIS — I6523 Occlusion and stenosis of bilateral carotid arteries: Secondary | ICD-10-CM | POA: Diagnosis not present

## 2014-03-10 DIAGNOSIS — I6529 Occlusion and stenosis of unspecified carotid artery: Secondary | ICD-10-CM

## 2014-03-11 DIAGNOSIS — I6521 Occlusion and stenosis of right carotid artery: Secondary | ICD-10-CM | POA: Diagnosis not present

## 2014-03-11 DIAGNOSIS — Z23 Encounter for immunization: Secondary | ICD-10-CM | POA: Diagnosis not present

## 2014-03-11 DIAGNOSIS — E785 Hyperlipidemia, unspecified: Secondary | ICD-10-CM | POA: Diagnosis not present

## 2014-03-11 DIAGNOSIS — M859 Disorder of bone density and structure, unspecified: Secondary | ICD-10-CM | POA: Diagnosis not present

## 2014-03-11 DIAGNOSIS — E2839 Other primary ovarian failure: Secondary | ICD-10-CM | POA: Diagnosis not present

## 2014-04-24 IMAGING — US US CAROTID DUPLEX BILAT
1 series · 13 of 24 positions shown · non-contrast
Comparison: 02/25/2012, 01/16/2011

CLINICAL DATA: Hyperlipidemia, asymptomatic left carotid bruit

EXAM:
BILATERAL CAROTID DUPLEX ULTRASOUND
TECHNIQUE: Gray scale imaging, color Doppler and duplex ultrasound were
performed of bilateral carotid and vertebral arteries in the neck.

[Series 1: us carotid duplex bilat · 0.08mm/px · 13 of 67 slices shown]
[im 1/67]
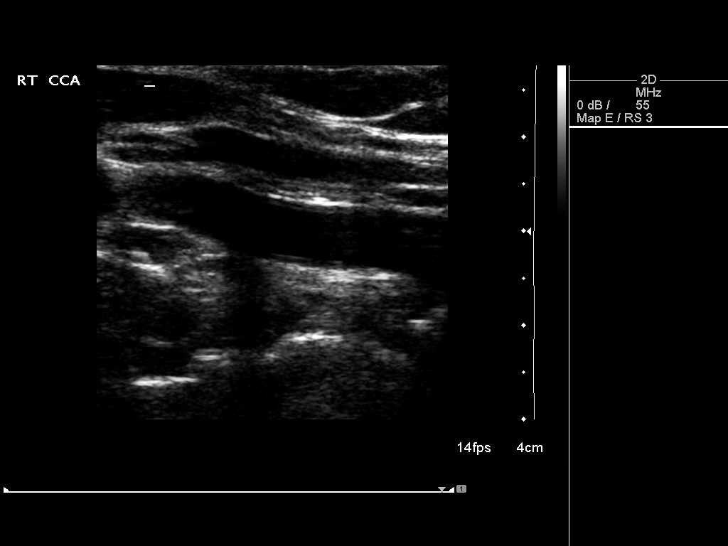
[im 6/67]
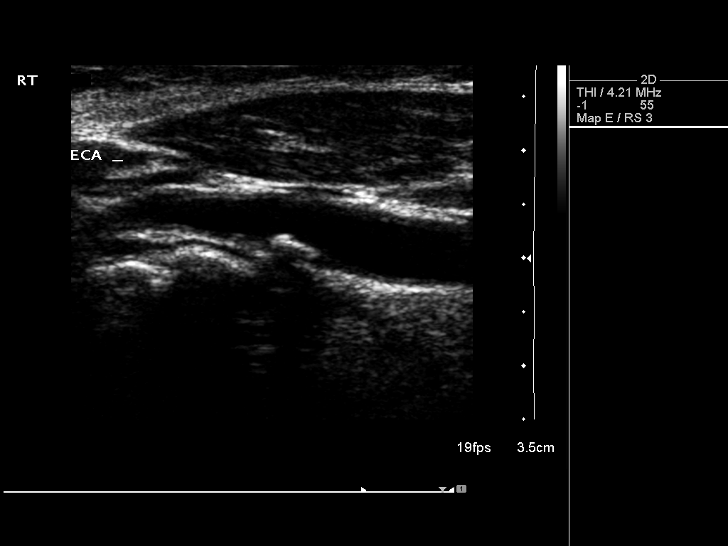
[im 12/67]
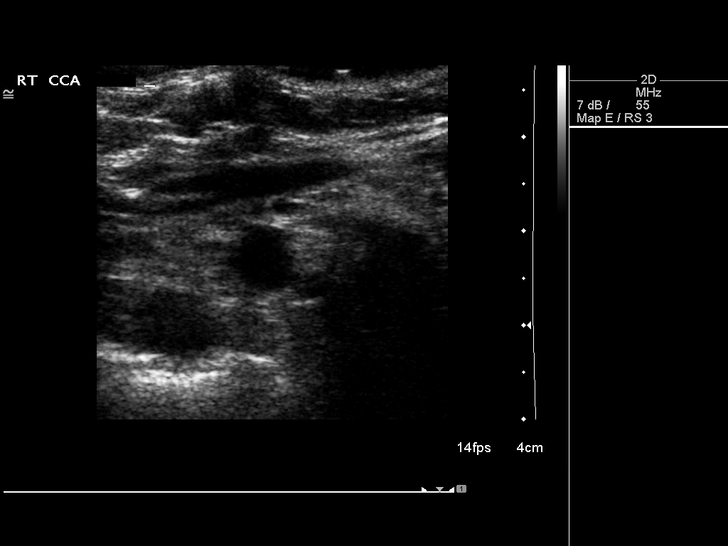
[im 18/67]
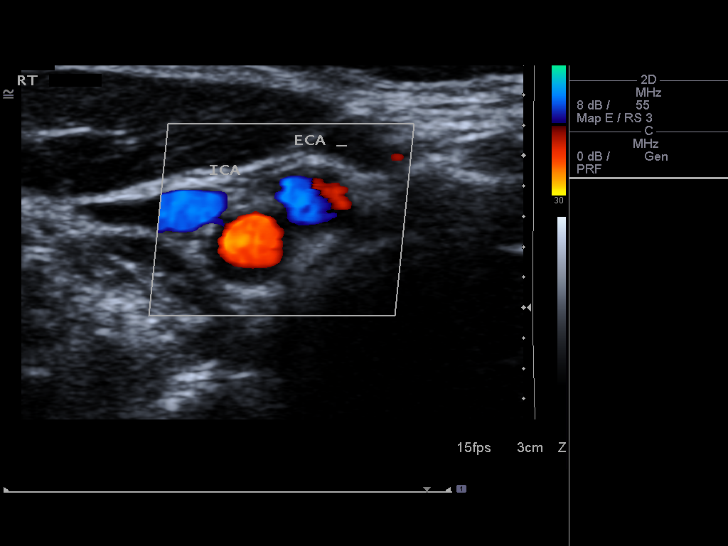
[im 23/67]
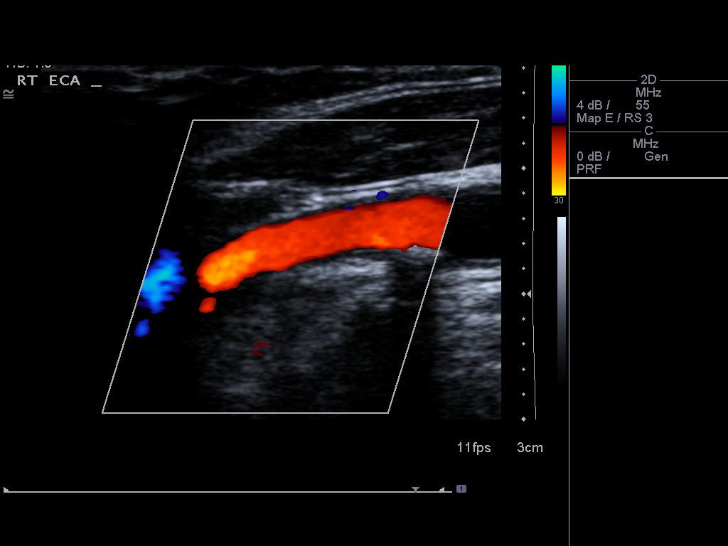
[im 29/67]
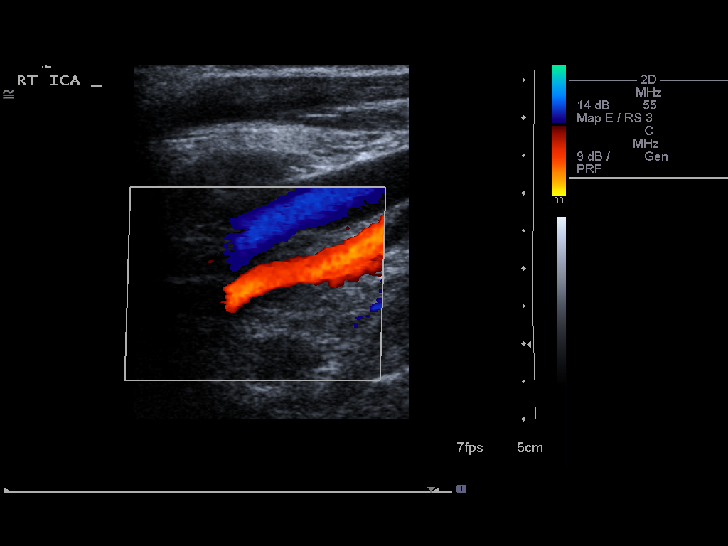
[im 35/67]
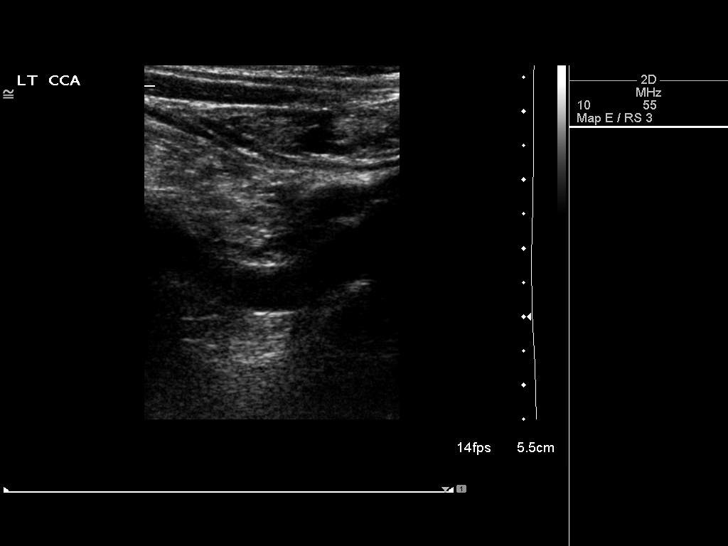
[im 38/67]
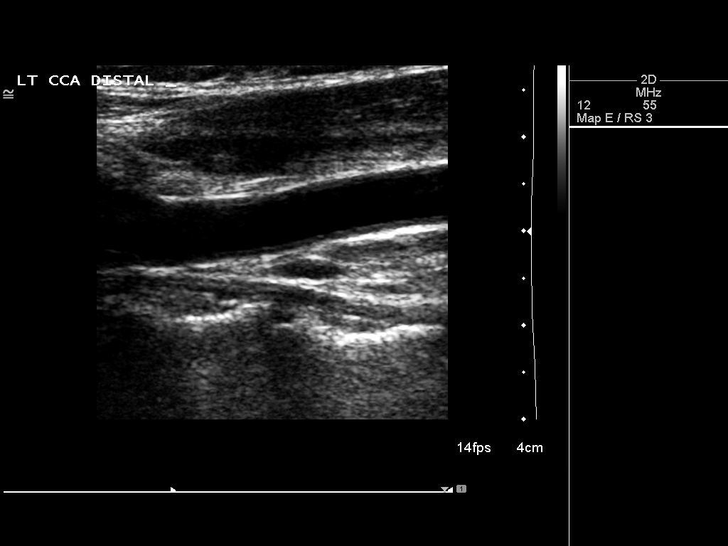
[im 44/67]
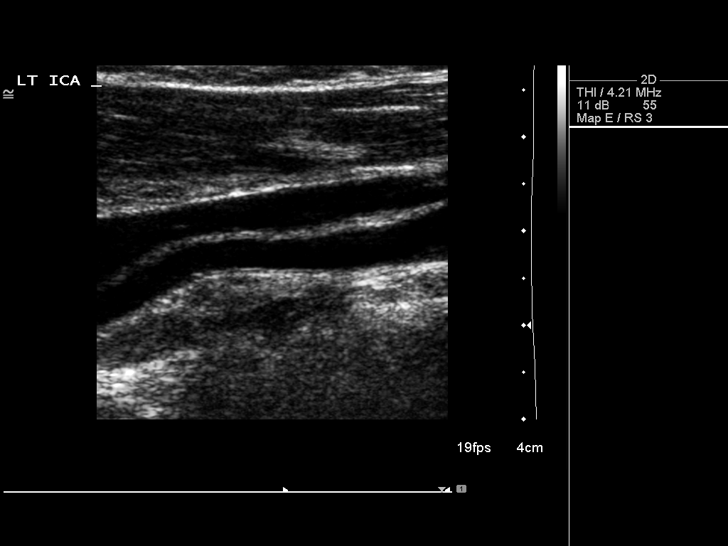
[im 49/67]
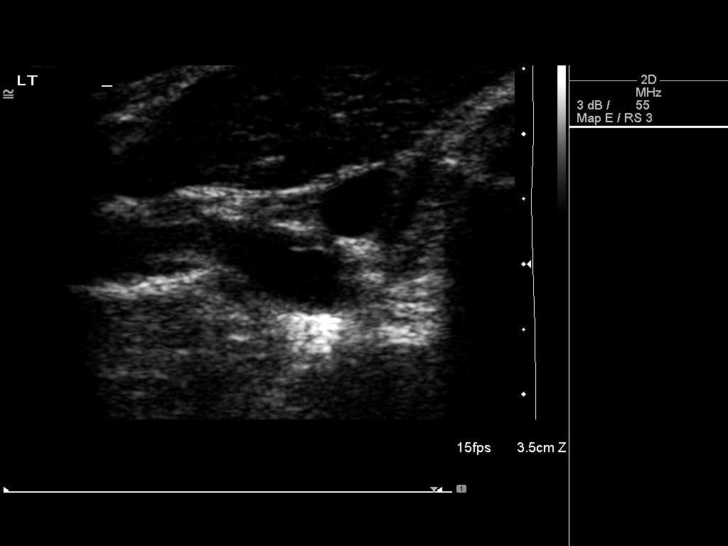
[im 55/67]
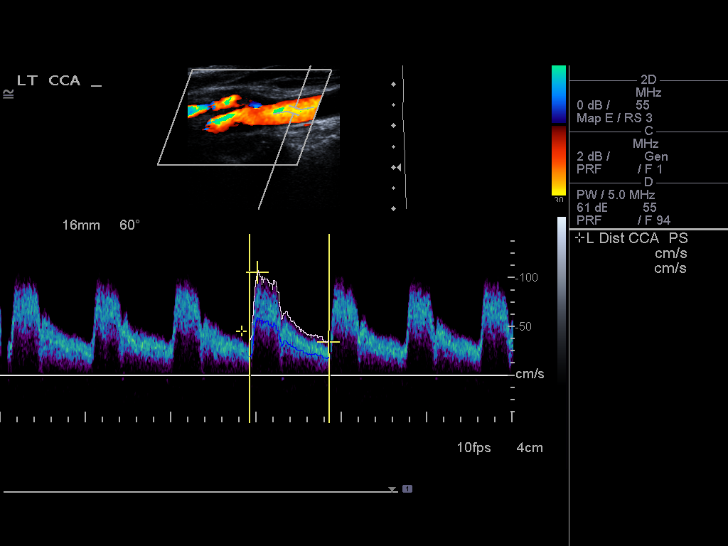
[im 61/67]
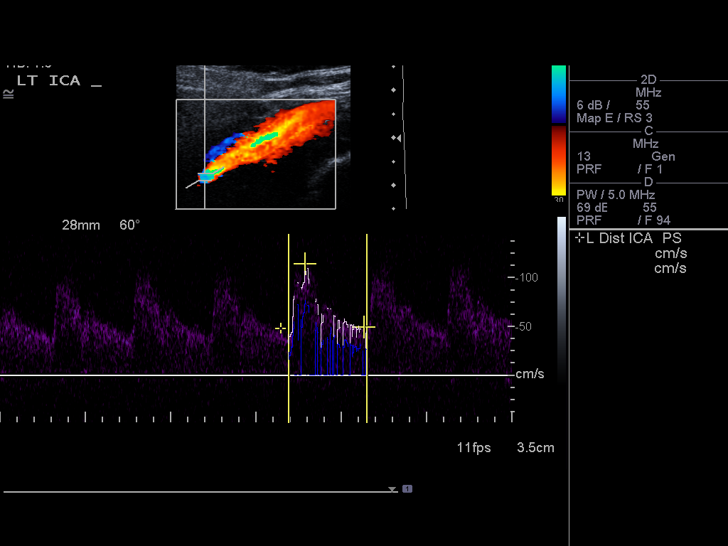
[im 67/67]
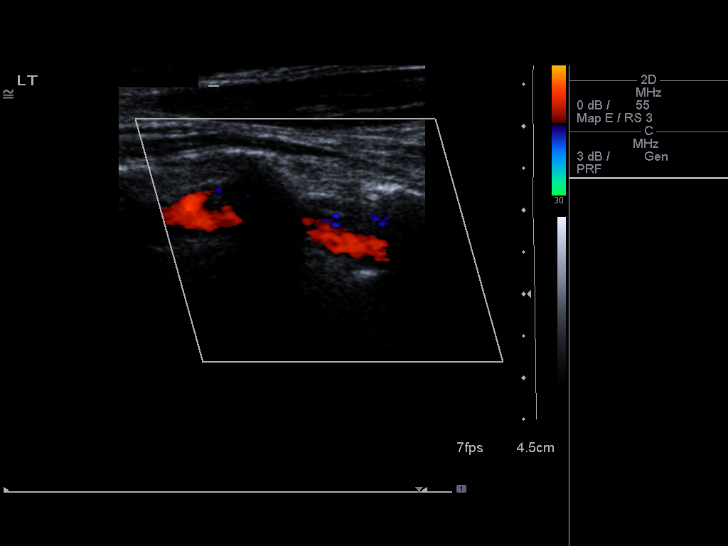

[13 of 24 positions shown; findings below may reference images not displayed]

FINDINGS: Criteria: Quantification of carotid stenosis is based on velocity
parameters that correlate the residual internal carotid diameter
with NASCET-based stenosis levels, using the diameter of the distal
internal carotid lumen as the denominator for stenosis measurement.

The following velocity measurements were obtained:

RIGHT

ICA:  155/65 cm/sec

CCA:  107/34 cm/sec

SYSTOLIC ICA/CCA RATIO:

DIASTOLIC ICA/CCA RATIO:

ECA:  144 cm/sec

LEFT

ICA:  115/50 cm/sec

CCA:  105/34 cm/sec

SYSTOLIC ICA/CCA RATIO:

DIASTOLIC ICA/CCA RATIO:

ECA:  135 cm/sec

RIGHT CAROTID ARTERY: Similar minor right carotid system plaque
formation with intimal thickening and echogenic plaque formation.
Stable mild right ICA velocity elevation as above. Degree of
stenosis remains within the 50- 69% range, closer to the lower end
of that range.

RIGHT VERTEBRAL ARTERY:  Antegrade

LEFT CAROTID ARTERY: Similar minor left carotid system
atherosclerosis. No hemodynamically significant left ICA stenosis,
velocity elevation, or turbulent flow. Degree of narrowing less than
50%.

LEFT VERTEBRAL ARTERY:  Antegrade
IMPRESSION: Stable moderate right ICA stenosis within the 50- 69% range.

Minor left ICA narrowing, less than 50%.

Overall stable exam.

## 2014-05-19 DIAGNOSIS — D1801 Hemangioma of skin and subcutaneous tissue: Secondary | ICD-10-CM | POA: Diagnosis not present

## 2014-05-19 DIAGNOSIS — L433 Subacute (active) lichen planus: Secondary | ICD-10-CM | POA: Diagnosis not present

## 2014-05-19 DIAGNOSIS — L720 Epidermal cyst: Secondary | ICD-10-CM | POA: Diagnosis not present

## 2014-05-19 DIAGNOSIS — L821 Other seborrheic keratosis: Secondary | ICD-10-CM | POA: Diagnosis not present

## 2014-05-19 DIAGNOSIS — L853 Xerosis cutis: Secondary | ICD-10-CM | POA: Diagnosis not present

## 2014-05-19 DIAGNOSIS — L814 Other melanin hyperpigmentation: Secondary | ICD-10-CM | POA: Diagnosis not present

## 2014-06-14 DIAGNOSIS — C50912 Malignant neoplasm of unspecified site of left female breast: Secondary | ICD-10-CM | POA: Diagnosis not present

## 2014-07-06 DIAGNOSIS — H2513 Age-related nuclear cataract, bilateral: Secondary | ICD-10-CM | POA: Diagnosis not present

## 2014-08-24 DIAGNOSIS — H2511 Age-related nuclear cataract, right eye: Secondary | ICD-10-CM | POA: Diagnosis not present

## 2014-08-24 DIAGNOSIS — H18412 Arcus senilis, left eye: Secondary | ICD-10-CM | POA: Diagnosis not present

## 2014-08-24 DIAGNOSIS — H2512 Age-related nuclear cataract, left eye: Secondary | ICD-10-CM | POA: Diagnosis not present

## 2014-08-24 DIAGNOSIS — H18411 Arcus senilis, right eye: Secondary | ICD-10-CM | POA: Diagnosis not present

## 2014-09-09 DIAGNOSIS — M859 Disorder of bone density and structure, unspecified: Secondary | ICD-10-CM | POA: Diagnosis not present

## 2014-09-09 DIAGNOSIS — E785 Hyperlipidemia, unspecified: Secondary | ICD-10-CM | POA: Diagnosis not present

## 2014-09-16 DIAGNOSIS — N182 Chronic kidney disease, stage 2 (mild): Secondary | ICD-10-CM | POA: Diagnosis not present

## 2014-09-16 DIAGNOSIS — I6521 Occlusion and stenosis of right carotid artery: Secondary | ICD-10-CM | POA: Diagnosis not present

## 2014-09-16 DIAGNOSIS — M8580 Other specified disorders of bone density and structure, unspecified site: Secondary | ICD-10-CM | POA: Diagnosis not present

## 2014-09-16 DIAGNOSIS — E785 Hyperlipidemia, unspecified: Secondary | ICD-10-CM | POA: Diagnosis not present

## 2015-01-10 DIAGNOSIS — H2511 Age-related nuclear cataract, right eye: Secondary | ICD-10-CM | POA: Diagnosis not present

## 2015-01-10 DIAGNOSIS — H25811 Combined forms of age-related cataract, right eye: Secondary | ICD-10-CM | POA: Diagnosis not present

## 2015-01-11 DIAGNOSIS — H2512 Age-related nuclear cataract, left eye: Secondary | ICD-10-CM | POA: Diagnosis not present

## 2015-01-11 DIAGNOSIS — H25012 Cortical age-related cataract, left eye: Secondary | ICD-10-CM | POA: Diagnosis not present

## 2015-01-24 DIAGNOSIS — H2512 Age-related nuclear cataract, left eye: Secondary | ICD-10-CM | POA: Diagnosis not present

## 2015-01-24 DIAGNOSIS — H25812 Combined forms of age-related cataract, left eye: Secondary | ICD-10-CM | POA: Diagnosis not present

## 2015-02-01 DIAGNOSIS — H2512 Age-related nuclear cataract, left eye: Secondary | ICD-10-CM | POA: Diagnosis not present

## 2015-02-16 ENCOUNTER — Other Ambulatory Visit: Payer: Self-pay

## 2015-02-16 DIAGNOSIS — Z1231 Encounter for screening mammogram for malignant neoplasm of breast: Secondary | ICD-10-CM

## 2015-03-15 DIAGNOSIS — Z1389 Encounter for screening for other disorder: Secondary | ICD-10-CM | POA: Diagnosis not present

## 2015-03-15 DIAGNOSIS — E559 Vitamin D deficiency, unspecified: Secondary | ICD-10-CM | POA: Diagnosis not present

## 2015-03-15 DIAGNOSIS — M8580 Other specified disorders of bone density and structure, unspecified site: Secondary | ICD-10-CM | POA: Diagnosis not present

## 2015-03-15 DIAGNOSIS — E785 Hyperlipidemia, unspecified: Secondary | ICD-10-CM | POA: Diagnosis not present

## 2015-03-15 DIAGNOSIS — Z0000 Encounter for general adult medical examination without abnormal findings: Secondary | ICD-10-CM | POA: Diagnosis not present

## 2015-03-15 DIAGNOSIS — Z23 Encounter for immunization: Secondary | ICD-10-CM | POA: Diagnosis not present

## 2015-03-22 DIAGNOSIS — M8580 Other specified disorders of bone density and structure, unspecified site: Secondary | ICD-10-CM | POA: Diagnosis not present

## 2015-03-22 DIAGNOSIS — Z23 Encounter for immunization: Secondary | ICD-10-CM | POA: Diagnosis not present

## 2015-03-22 DIAGNOSIS — E785 Hyperlipidemia, unspecified: Secondary | ICD-10-CM | POA: Diagnosis not present

## 2015-03-22 DIAGNOSIS — N182 Chronic kidney disease, stage 2 (mild): Secondary | ICD-10-CM | POA: Diagnosis not present

## 2015-03-22 DIAGNOSIS — I739 Peripheral vascular disease, unspecified: Secondary | ICD-10-CM | POA: Diagnosis not present

## 2015-03-23 ENCOUNTER — Ambulatory Visit
Admission: RE | Admit: 2015-03-23 | Discharge: 2015-03-23 | Disposition: A | Discharge status: Home / Self Care | Payer: Medicare Other | Source: Ambulatory Visit

## 2015-03-23 DIAGNOSIS — Z1231 Encounter for screening mammogram for malignant neoplasm of breast: Secondary | ICD-10-CM | POA: Diagnosis not present

## 2015-06-07 DIAGNOSIS — L433 Subacute (active) lichen planus: Secondary | ICD-10-CM | POA: Diagnosis not present

## 2015-06-07 DIAGNOSIS — D225 Melanocytic nevi of trunk: Secondary | ICD-10-CM | POA: Diagnosis not present

## 2015-06-07 DIAGNOSIS — D2222 Melanocytic nevi of left ear and external auricular canal: Secondary | ICD-10-CM | POA: Diagnosis not present

## 2015-06-07 DIAGNOSIS — L821 Other seborrheic keratosis: Secondary | ICD-10-CM | POA: Diagnosis not present

## 2015-07-04 ENCOUNTER — Other Ambulatory Visit: Payer: Self-pay | Admitting: Internal Medicine

## 2015-07-04 DIAGNOSIS — C50912 Malignant neoplasm of unspecified site of left female breast: Secondary | ICD-10-CM | POA: Diagnosis not present

## 2015-07-04 DIAGNOSIS — Z853 Personal history of malignant neoplasm of breast: Secondary | ICD-10-CM

## 2015-07-04 DIAGNOSIS — Z170 Estrogen receptor positive status [ER+]: Secondary | ICD-10-CM | POA: Diagnosis not present

## 2015-07-19 DIAGNOSIS — L433 Subacute (active) lichen planus: Secondary | ICD-10-CM | POA: Diagnosis not present

## 2015-07-19 DIAGNOSIS — H2513 Age-related nuclear cataract, bilateral: Secondary | ICD-10-CM | POA: Diagnosis not present

## 2015-09-13 DIAGNOSIS — M8580 Other specified disorders of bone density and structure, unspecified site: Secondary | ICD-10-CM | POA: Diagnosis not present

## 2015-09-13 DIAGNOSIS — E785 Hyperlipidemia, unspecified: Secondary | ICD-10-CM | POA: Diagnosis not present

## 2015-09-13 DIAGNOSIS — E559 Vitamin D deficiency, unspecified: Secondary | ICD-10-CM | POA: Diagnosis not present

## 2015-09-20 DIAGNOSIS — M8580 Other specified disorders of bone density and structure, unspecified site: Secondary | ICD-10-CM | POA: Diagnosis not present

## 2015-09-20 DIAGNOSIS — I739 Peripheral vascular disease, unspecified: Secondary | ICD-10-CM | POA: Diagnosis not present

## 2015-09-20 DIAGNOSIS — N182 Chronic kidney disease, stage 2 (mild): Secondary | ICD-10-CM | POA: Diagnosis not present

## 2015-09-20 DIAGNOSIS — E785 Hyperlipidemia, unspecified: Secondary | ICD-10-CM | POA: Diagnosis not present

## 2015-10-16 DIAGNOSIS — J00 Acute nasopharyngitis [common cold]: Secondary | ICD-10-CM | POA: Diagnosis not present

## 2016-01-31 ENCOUNTER — Other Ambulatory Visit: Payer: Self-pay

## 2016-02-13 DIAGNOSIS — Z23 Encounter for immunization: Secondary | ICD-10-CM | POA: Diagnosis not present

## 2016-03-05 ENCOUNTER — Other Ambulatory Visit: Payer: Self-pay | Admitting: Internal Medicine

## 2016-03-05 DIAGNOSIS — Z1231 Encounter for screening mammogram for malignant neoplasm of breast: Secondary | ICD-10-CM

## 2016-04-18 DIAGNOSIS — E559 Vitamin D deficiency, unspecified: Secondary | ICD-10-CM | POA: Diagnosis not present

## 2016-04-18 DIAGNOSIS — Z0000 Encounter for general adult medical examination without abnormal findings: Secondary | ICD-10-CM | POA: Diagnosis not present

## 2016-04-18 DIAGNOSIS — E785 Hyperlipidemia, unspecified: Secondary | ICD-10-CM | POA: Diagnosis not present

## 2016-04-18 DIAGNOSIS — M8580 Other specified disorders of bone density and structure, unspecified site: Secondary | ICD-10-CM | POA: Diagnosis not present

## 2016-04-30 DIAGNOSIS — M859 Disorder of bone density and structure, unspecified: Secondary | ICD-10-CM | POA: Diagnosis not present

## 2016-04-30 DIAGNOSIS — Z0000 Encounter for general adult medical examination without abnormal findings: Secondary | ICD-10-CM | POA: Diagnosis not present

## 2016-04-30 DIAGNOSIS — I6521 Occlusion and stenosis of right carotid artery: Secondary | ICD-10-CM | POA: Diagnosis not present

## 2016-04-30 DIAGNOSIS — E785 Hyperlipidemia, unspecified: Secondary | ICD-10-CM | POA: Diagnosis not present

## 2016-05-09 ENCOUNTER — Ambulatory Visit
Admission: RE | Admit: 2016-05-09 | Discharge: 2016-05-09 | Disposition: A | Discharge status: Home / Self Care | Payer: Medicare Other | Source: Ambulatory Visit | Attending: Internal Medicine | Admitting: Internal Medicine

## 2016-05-09 DIAGNOSIS — Z780 Asymptomatic menopausal state: Secondary | ICD-10-CM | POA: Diagnosis not present

## 2016-05-09 DIAGNOSIS — Z1231 Encounter for screening mammogram for malignant neoplasm of breast: Secondary | ICD-10-CM

## 2016-05-09 DIAGNOSIS — Z13820 Encounter for screening for osteoporosis: Secondary | ICD-10-CM | POA: Diagnosis not present

## 2016-05-09 DIAGNOSIS — Z853 Personal history of malignant neoplasm of breast: Secondary | ICD-10-CM

## 2016-06-05 DIAGNOSIS — L433 Subacute (active) lichen planus: Secondary | ICD-10-CM | POA: Diagnosis not present

## 2016-06-05 DIAGNOSIS — L853 Xerosis cutis: Secondary | ICD-10-CM | POA: Diagnosis not present

## 2016-06-05 DIAGNOSIS — D1801 Hemangioma of skin and subcutaneous tissue: Secondary | ICD-10-CM | POA: Diagnosis not present

## 2016-06-05 DIAGNOSIS — L814 Other melanin hyperpigmentation: Secondary | ICD-10-CM | POA: Diagnosis not present

## 2016-06-05 DIAGNOSIS — L821 Other seborrheic keratosis: Secondary | ICD-10-CM | POA: Diagnosis not present

## 2016-08-06 DIAGNOSIS — Z170 Estrogen receptor positive status [ER+]: Secondary | ICD-10-CM | POA: Diagnosis not present

## 2016-08-06 DIAGNOSIS — C50912 Malignant neoplasm of unspecified site of left female breast: Secondary | ICD-10-CM | POA: Diagnosis not present

## 2016-08-16 DIAGNOSIS — H2513 Age-related nuclear cataract, bilateral: Secondary | ICD-10-CM | POA: Diagnosis not present

## 2016-11-08 DIAGNOSIS — E785 Hyperlipidemia, unspecified: Secondary | ICD-10-CM | POA: Diagnosis not present

## 2016-11-13 DIAGNOSIS — E785 Hyperlipidemia, unspecified: Secondary | ICD-10-CM | POA: Diagnosis not present

## 2016-11-13 DIAGNOSIS — I6521 Occlusion and stenosis of right carotid artery: Secondary | ICD-10-CM | POA: Diagnosis not present

## 2016-11-13 DIAGNOSIS — M859 Disorder of bone density and structure, unspecified: Secondary | ICD-10-CM | POA: Diagnosis not present

## 2016-12-24 DIAGNOSIS — E785 Hyperlipidemia, unspecified: Secondary | ICD-10-CM | POA: Diagnosis not present

## 2016-12-24 DIAGNOSIS — Z1211 Encounter for screening for malignant neoplasm of colon: Secondary | ICD-10-CM | POA: Diagnosis not present

## 2016-12-24 DIAGNOSIS — Z0001 Encounter for general adult medical examination with abnormal findings: Secondary | ICD-10-CM | POA: Diagnosis not present

## 2016-12-24 DIAGNOSIS — Z1231 Encounter for screening mammogram for malignant neoplasm of breast: Secondary | ICD-10-CM | POA: Diagnosis not present

## 2016-12-24 DIAGNOSIS — Z872 Personal history of diseases of the skin and subcutaneous tissue: Secondary | ICD-10-CM | POA: Diagnosis not present

## 2016-12-24 DIAGNOSIS — Z780 Asymptomatic menopausal state: Secondary | ICD-10-CM | POA: Diagnosis not present

## 2016-12-24 DIAGNOSIS — Z853 Personal history of malignant neoplasm of breast: Secondary | ICD-10-CM | POA: Diagnosis not present

## 2016-12-24 DIAGNOSIS — Z1389 Encounter for screening for other disorder: Secondary | ICD-10-CM | POA: Diagnosis not present

## 2016-12-24 DIAGNOSIS — E559 Vitamin D deficiency, unspecified: Secondary | ICD-10-CM | POA: Diagnosis not present

## 2016-12-24 DIAGNOSIS — Z789 Other specified health status: Secondary | ICD-10-CM | POA: Diagnosis not present

## 2016-12-24 DIAGNOSIS — Z719 Counseling, unspecified: Secondary | ICD-10-CM | POA: Diagnosis not present

## 2016-12-31 DIAGNOSIS — Z1231 Encounter for screening mammogram for malignant neoplasm of breast: Secondary | ICD-10-CM | POA: Diagnosis not present

## 2016-12-31 DIAGNOSIS — E559 Vitamin D deficiency, unspecified: Secondary | ICD-10-CM | POA: Diagnosis not present

## 2016-12-31 DIAGNOSIS — Z0001 Encounter for general adult medical examination with abnormal findings: Secondary | ICD-10-CM | POA: Diagnosis not present

## 2016-12-31 DIAGNOSIS — Z872 Personal history of diseases of the skin and subcutaneous tissue: Secondary | ICD-10-CM | POA: Diagnosis not present

## 2016-12-31 DIAGNOSIS — Z853 Personal history of malignant neoplasm of breast: Secondary | ICD-10-CM | POA: Diagnosis not present

## 2016-12-31 DIAGNOSIS — Z1389 Encounter for screening for other disorder: Secondary | ICD-10-CM | POA: Diagnosis not present

## 2016-12-31 DIAGNOSIS — Z1211 Encounter for screening for malignant neoplasm of colon: Secondary | ICD-10-CM | POA: Diagnosis not present

## 2016-12-31 DIAGNOSIS — Z789 Other specified health status: Secondary | ICD-10-CM | POA: Diagnosis not present

## 2016-12-31 DIAGNOSIS — Z780 Asymptomatic menopausal state: Secondary | ICD-10-CM | POA: Diagnosis not present

## 2016-12-31 DIAGNOSIS — E785 Hyperlipidemia, unspecified: Secondary | ICD-10-CM | POA: Diagnosis not present

## 2016-12-31 DIAGNOSIS — Z719 Counseling, unspecified: Secondary | ICD-10-CM | POA: Diagnosis not present

## 2017-01-09 DIAGNOSIS — S61211A Laceration without foreign body of left index finger without damage to nail, initial encounter: Secondary | ICD-10-CM | POA: Diagnosis not present

## 2017-01-31 DIAGNOSIS — L438 Other lichen planus: Secondary | ICD-10-CM | POA: Diagnosis not present

## 2017-01-31 DIAGNOSIS — L578 Other skin changes due to chronic exposure to nonionizing radiation: Secondary | ICD-10-CM | POA: Diagnosis not present

## 2017-03-18 DIAGNOSIS — R030 Elevated blood-pressure reading, without diagnosis of hypertension: Secondary | ICD-10-CM | POA: Diagnosis not present

## 2017-04-08 DIAGNOSIS — R030 Elevated blood-pressure reading, without diagnosis of hypertension: Secondary | ICD-10-CM | POA: Diagnosis not present

## 2017-06-25 DIAGNOSIS — Z789 Other specified health status: Secondary | ICD-10-CM | POA: Diagnosis not present

## 2017-06-25 DIAGNOSIS — E785 Hyperlipidemia, unspecified: Secondary | ICD-10-CM | POA: Diagnosis not present

## 2017-06-25 DIAGNOSIS — Z853 Personal history of malignant neoplasm of breast: Secondary | ICD-10-CM | POA: Diagnosis not present

## 2017-06-25 DIAGNOSIS — Z6825 Body mass index (BMI) 25.0-25.9, adult: Secondary | ICD-10-CM | POA: Diagnosis not present

## 2017-06-25 DIAGNOSIS — M8580 Other specified disorders of bone density and structure, unspecified site: Secondary | ICD-10-CM | POA: Diagnosis not present

## 2017-06-25 DIAGNOSIS — Z872 Personal history of diseases of the skin and subcutaneous tissue: Secondary | ICD-10-CM | POA: Diagnosis not present

## 2017-06-25 DIAGNOSIS — Z719 Counseling, unspecified: Secondary | ICD-10-CM | POA: Diagnosis not present

## 2017-06-25 DIAGNOSIS — M79671 Pain in right foot: Secondary | ICD-10-CM | POA: Diagnosis not present

## 2017-06-25 DIAGNOSIS — M150 Primary generalized (osteo)arthritis: Secondary | ICD-10-CM | POA: Diagnosis not present

## 2017-06-25 DIAGNOSIS — I6521 Occlusion and stenosis of right carotid artery: Secondary | ICD-10-CM | POA: Diagnosis not present

## 2017-06-25 DIAGNOSIS — E559 Vitamin D deficiency, unspecified: Secondary | ICD-10-CM | POA: Diagnosis not present

## 2017-06-26 DIAGNOSIS — M79672 Pain in left foot: Secondary | ICD-10-CM | POA: Diagnosis not present

## 2017-06-26 DIAGNOSIS — M19072 Primary osteoarthritis, left ankle and foot: Secondary | ICD-10-CM | POA: Diagnosis not present

## 2017-06-27 DIAGNOSIS — I6521 Occlusion and stenosis of right carotid artery: Secondary | ICD-10-CM | POA: Diagnosis not present

## 2017-07-03 DIAGNOSIS — M7732 Calcaneal spur, left foot: Secondary | ICD-10-CM | POA: Diagnosis not present

## 2017-07-03 DIAGNOSIS — M79672 Pain in left foot: Secondary | ICD-10-CM | POA: Diagnosis not present

## 2017-07-03 DIAGNOSIS — M722 Plantar fascial fibromatosis: Secondary | ICD-10-CM | POA: Diagnosis not present

## 2017-07-04 DIAGNOSIS — D225 Melanocytic nevi of trunk: Secondary | ICD-10-CM | POA: Diagnosis not present

## 2017-07-04 DIAGNOSIS — D485 Neoplasm of uncertain behavior of skin: Secondary | ICD-10-CM | POA: Diagnosis not present

## 2017-07-04 DIAGNOSIS — L814 Other melanin hyperpigmentation: Secondary | ICD-10-CM | POA: Diagnosis not present

## 2017-07-04 DIAGNOSIS — L309 Dermatitis, unspecified: Secondary | ICD-10-CM | POA: Diagnosis not present

## 2017-07-04 DIAGNOSIS — L821 Other seborrheic keratosis: Secondary | ICD-10-CM | POA: Diagnosis not present

## 2017-07-08 DIAGNOSIS — M85831 Other specified disorders of bone density and structure, right forearm: Secondary | ICD-10-CM | POA: Diagnosis not present

## 2017-07-08 DIAGNOSIS — R921 Mammographic calcification found on diagnostic imaging of breast: Secondary | ICD-10-CM | POA: Diagnosis not present

## 2017-07-08 DIAGNOSIS — M8588 Other specified disorders of bone density and structure, other site: Secondary | ICD-10-CM | POA: Diagnosis not present

## 2017-07-08 DIAGNOSIS — Z1231 Encounter for screening mammogram for malignant neoplasm of breast: Secondary | ICD-10-CM | POA: Diagnosis not present

## 2017-07-16 DIAGNOSIS — I6523 Occlusion and stenosis of bilateral carotid arteries: Secondary | ICD-10-CM | POA: Diagnosis not present

## 2017-07-17 DIAGNOSIS — M79672 Pain in left foot: Secondary | ICD-10-CM | POA: Diagnosis not present

## 2017-07-17 DIAGNOSIS — M722 Plantar fascial fibromatosis: Secondary | ICD-10-CM | POA: Diagnosis not present

## 2017-07-17 DIAGNOSIS — M7732 Calcaneal spur, left foot: Secondary | ICD-10-CM | POA: Diagnosis not present

## 2017-08-01 DIAGNOSIS — Z961 Presence of intraocular lens: Secondary | ICD-10-CM | POA: Diagnosis not present

## 2017-08-08 DIAGNOSIS — L244 Irritant contact dermatitis due to drugs in contact with skin: Secondary | ICD-10-CM | POA: Diagnosis not present

## 2017-10-03 DIAGNOSIS — C50912 Malignant neoplasm of unspecified site of left female breast: Secondary | ICD-10-CM | POA: Diagnosis not present

## 2017-10-03 DIAGNOSIS — Z170 Estrogen receptor positive status [ER+]: Secondary | ICD-10-CM | POA: Diagnosis not present

## 2017-10-30 DIAGNOSIS — L578 Other skin changes due to chronic exposure to nonionizing radiation: Secondary | ICD-10-CM | POA: Diagnosis not present

## 2017-10-30 DIAGNOSIS — L244 Irritant contact dermatitis due to drugs in contact with skin: Secondary | ICD-10-CM | POA: Diagnosis not present

## 2017-11-20 DIAGNOSIS — N390 Urinary tract infection, site not specified: Secondary | ICD-10-CM | POA: Diagnosis not present

## 2017-11-20 DIAGNOSIS — B9620 Unspecified Escherichia coli [E. coli] as the cause of diseases classified elsewhere: Secondary | ICD-10-CM | POA: Diagnosis not present

## 2017-11-20 DIAGNOSIS — R300 Dysuria: Secondary | ICD-10-CM | POA: Diagnosis not present

## 2017-11-20 DIAGNOSIS — R8290 Unspecified abnormal findings in urine: Secondary | ICD-10-CM | POA: Diagnosis not present

## 2018-01-01 DIAGNOSIS — Z853 Personal history of malignant neoplasm of breast: Secondary | ICD-10-CM | POA: Diagnosis not present

## 2018-01-01 DIAGNOSIS — E785 Hyperlipidemia, unspecified: Secondary | ICD-10-CM | POA: Diagnosis not present

## 2018-01-01 DIAGNOSIS — R030 Elevated blood-pressure reading, without diagnosis of hypertension: Secondary | ICD-10-CM | POA: Diagnosis not present

## 2018-01-01 DIAGNOSIS — Z872 Personal history of diseases of the skin and subcutaneous tissue: Secondary | ICD-10-CM | POA: Diagnosis not present

## 2018-01-01 DIAGNOSIS — Z6824 Body mass index (BMI) 24.0-24.9, adult: Secondary | ICD-10-CM | POA: Diagnosis not present

## 2018-01-01 DIAGNOSIS — I6521 Occlusion and stenosis of right carotid artery: Secondary | ICD-10-CM | POA: Diagnosis not present

## 2018-01-01 DIAGNOSIS — M150 Primary generalized (osteo)arthritis: Secondary | ICD-10-CM | POA: Diagnosis not present

## 2018-01-01 DIAGNOSIS — Z1331 Encounter for screening for depression: Secondary | ICD-10-CM | POA: Diagnosis not present

## 2018-01-01 DIAGNOSIS — M8580 Other specified disorders of bone density and structure, unspecified site: Secondary | ICD-10-CM | POA: Diagnosis not present

## 2018-01-01 DIAGNOSIS — Z1211 Encounter for screening for malignant neoplasm of colon: Secondary | ICD-10-CM | POA: Diagnosis not present

## 2018-01-01 DIAGNOSIS — Z0001 Encounter for general adult medical examination with abnormal findings: Secondary | ICD-10-CM | POA: Diagnosis not present

## 2018-01-01 DIAGNOSIS — E559 Vitamin D deficiency, unspecified: Secondary | ICD-10-CM | POA: Diagnosis not present

## 2018-01-03 DIAGNOSIS — Z1211 Encounter for screening for malignant neoplasm of colon: Secondary | ICD-10-CM | POA: Diagnosis not present
